# Patient Record
Sex: Male | Born: 1985
Health system: Southern US, Community
[De-identification: ages and names within clinical notes are randomized; demographics above are authoritative.]

## PROBLEM LIST (undated history)

## (undated) DIAGNOSIS — J302 Other seasonal allergic rhinitis: Secondary | ICD-10-CM

## (undated) DIAGNOSIS — Z9109 Other allergy status, other than to drugs and biological substances: Secondary | ICD-10-CM

## (undated) DIAGNOSIS — J45909 Unspecified asthma, uncomplicated: Secondary | ICD-10-CM

## (undated) HISTORY — PX: HAND SURGERY: SHX662

## (undated) HISTORY — PX: KNEE SURGERY: SHX244

---

## 2014-05-04 ENCOUNTER — Encounter: Payer: Self-pay | Admitting: *Deleted

## 2014-05-04 ENCOUNTER — Emergency Department (INDEPENDENT_AMBULATORY_CARE_PROVIDER_SITE_OTHER)
Admission: EM | Admit: 2014-05-04 | Discharge: 2014-05-04 | Disposition: A | Discharge status: Home / Self Care | Payer: 59 | Source: Home / Self Care | Attending: Emergency Medicine | Admitting: Emergency Medicine

## 2014-05-04 DIAGNOSIS — J209 Acute bronchitis, unspecified: Secondary | ICD-10-CM | POA: Diagnosis not present

## 2014-05-04 DIAGNOSIS — M545 Low back pain, unspecified: Secondary | ICD-10-CM

## 2014-05-04 HISTORY — DX: Unspecified asthma, uncomplicated: J45.909

## 2014-05-04 HISTORY — DX: Other seasonal allergic rhinitis: J30.2

## 2014-05-04 HISTORY — DX: Other allergy status, other than to drugs and biological substances: Z91.09

## 2014-05-04 MED ORDER — PREDNISONE (PAK) 10 MG PO TABS: ORAL_TABLET | ORAL | Status: DC

## 2014-05-04 MED ORDER — CYCLOBENZAPRINE HCL 10 MG PO TABS: ORAL_TABLET | ORAL | Status: DC

## 2014-05-04 MED ORDER — AZITHROMYCIN 250 MG PO TABS: ORAL_TABLET | ORAL | Status: DC

## 2014-05-04 MED ORDER — ALBUTEROL SULFATE HFA 108 (90 BASE) MCG/ACT IN AERS: 2.0000 | INHALATION_SPRAY | RESPIRATORY_TRACT | Status: DC | PRN

## 2014-05-04 NOTE — ED Notes (Signed)
[name] Tran c/o non-productive cough, congestion and runny nose x yesterday. 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

## 2014-05-04 NOTE — ED Provider Notes (Signed)
CSN: [id number]     Arrival date & time 05/04/14  [id number] History   First MD Initiated Contact with Patient 05/04/14 [phone]     Chief Complaint  Patient presents with  . Cough  . Nasal Congestion   (Consider location/radiation/quality/duration/timing/severity/associated sxs/prior Treatment) HPI URI HISTORY  [name] FearingJames is a 29 y.o. male who complains of onset of cough and cold symptoms for 3 days.  Have been using over-the-counter treatment which helps a little bit. He also has a history of seasonal environmental allergies and occasional environmentally seasonally induced asthma.  No chills/sweats +  Fever  +  Nasal congestion +  Discolored Post-nasal drainage No sinus pain/pressure No sore throat   +  Cough, productive of discolored sputum Positive wheezing Positive chest congestion No hemoptysis No shortness of breath No pleuritic pain  No itchy/red eyes No earache  No nausea No vomiting No abdominal pain No diarrhea  No skin rashes +  Fatigue No myalgias No headache  Remainder of Review of Systems negative for acute change except as noted in the HPI.  Past Medical History  Diagnosis Date  . Asthma   . Seasonal allergies   . Environmental allergies    Past Surgical History  Procedure Laterality Date  . Hand surgery    . Knee surgery     History reviewed. No pertinent family history. History  Substance Use Topics  . Smoking status: Never Smoker   . Smokeless tobacco: Current User  . Alcohol Use: No    Review of Systems  Allergies  Peanuts  Home Medications   Prior to Admission medications   Medication Sig Start Date End Date Taking? Authorizing Provider  Albuterol Sulfate (VENTOLIN HFA IN) Inhale into the lungs.   Yes Historical Provider, MD  loratadine (CLARITIN) 10 MG tablet Take 10 mg by mouth daily.   Yes Historical Provider, MD  albuterol (PROVENTIL HFA;VENTOLIN HFA) 108 (90 BASE) MCG/ACT inhaler Inhale 2 puffs into the lungs every 4 (four) hours  as needed for wheezing or shortness of breath. 05/04/14   Lajean Manesavid Massey, MD  azithromycin (ZITHROMAX Z-PAK) 250 MG tablet Take 2 tablets on day one, then 1 tablet daily on days 2 through 5 05/04/14   Lajean Manesavid Massey, MD  cyclobenzaprine (FLEXERIL) 10 MG tablet 1/2 tablet every 8 hrs as needed for muscle relaxant. May take a whole tablet at bedtime. 05/04/14   Lajean Manesavid Massey, MD  predniSONE (STERAPRED UNI-PAK) 10 MG tablet Take as directed for 6 days.--Take 6 on day 1, 5 on day 2, 4 on day 3, then 3 tablets on day 4, then 2 tablets on day 5, then 1 on day 6. 05/04/14   Lajean Manesavid Massey, MD   BP 122/80 mmHg  Pulse 68  Temp(Src) 97.9 F (36.6 C) (Oral)  Resp 14  Ht 5\' 9"  (1.753 m)  Wt 209 lb (94.802 kg)  BMI 30.85 kg/m2  SpO2 99% Physical Exam  Constitutional: He is oriented to person, place, and time. He appears well-developed and well-nourished. No distress.  HENT:  Head: Normocephalic and atraumatic.  Right Ear: Tympanic membrane normal.  Left Ear: Tympanic membrane normal.  Mouth/Throat: Oropharynx is clear and moist. No oropharyngeal exudate.  Nose with boggy terminates mild searing mucoid drainage  Eyes: Right eye exhibits no discharge. Left eye exhibits no discharge. No scleral icterus.  Neck: Neck supple.  Cardiovascular: Normal rate, regular rhythm and normal heart sounds.   Pulmonary/Chest: No respiratory distress. He has wheezes. He has rhonchi. He has no rales.  Lymphadenopathy:    He has no cervical adenopathy.  Neurological: He is alert and oriented to person, place, and time.  Skin: Skin is warm and dry.  Nursing note and vitals reviewed.   ED Course  Procedures (including critical care time) Labs Review Labs Reviewed - No data to display  Imaging Review No results found.   MDM   1. Acute bronchitis with bronchospasm   2. Bilateral low back pain without sciatica    He also has bilateral paralumbar muscle spasm without any midline spinal symptoms or any neurologic  symptoms. Treatment options discussed, as well as risks, benefits, alternatives. Patient voiced understanding and agreement with the following plans:   New Prescriptions   ALBUTEROL (PROVENTIL HFA;VENTOLIN HFA) 108 (90 BASE) MCG/ACT INHALER    Inhale 2 puffs into the lungs every 4 (four) hours as needed for wheezing or shortness of breath.   AZITHROMYCIN (ZITHROMAX Z-PAK) 250 MG TABLET    Take 2 tablets on day one, then 1 tablet daily on days 2 through 5   CYCLOBENZAPRINE (FLEXERIL) 10 MG TABLET    1/2 tablet every 8 hrs as needed for muscle relaxant. May take a whole tablet at bedtime.   PREDNISONE (STERAPRED UNI-PAK) 10 MG TABLET    Take as directed for 6 days.--Take 6 on day 1, 5 on day 2, 4 on day 3, then 3 tablets on day 4, then 2 tablets on day 5, then 1 on day 6.   Follow-up with your primary care doctor in 5-7 days if not improving, or sooner if symptoms become worse. Precautions discussed. Red flags discussed. Questions invited and answered. Patient voiced understanding and agreement.   Lajean Manes, MD 05/08/14 [phone]

## 2015-09-02 DIAGNOSIS — J4521 Mild intermittent asthma with (acute) exacerbation: Secondary | ICD-10-CM | POA: Insufficient documentation

## 2016-10-23 ENCOUNTER — Emergency Department (HOSPITAL_BASED_OUTPATIENT_CLINIC_OR_DEPARTMENT_OTHER)
Admission: EM | Admit: 2016-10-23 | Discharge: 2016-10-23 | Disposition: A | Discharge status: Home / Self Care | Payer: Worker's Compensation | Attending: Emergency Medicine | Admitting: Emergency Medicine

## 2016-10-23 ENCOUNTER — Encounter (HOSPITAL_BASED_OUTPATIENT_CLINIC_OR_DEPARTMENT_OTHER): Payer: Self-pay

## 2016-10-23 DIAGNOSIS — X58XXXA Exposure to other specified factors, initial encounter: Secondary | ICD-10-CM | POA: Diagnosis not present

## 2016-10-23 DIAGNOSIS — S61412D Laceration without foreign body of left hand, subsequent encounter: Secondary | ICD-10-CM | POA: Diagnosis present

## 2016-10-23 DIAGNOSIS — Z79899 Other long term (current) drug therapy: Secondary | ICD-10-CM | POA: Diagnosis not present

## 2016-10-23 DIAGNOSIS — Z4802 Encounter for removal of sutures: Secondary | ICD-10-CM

## 2016-10-23 DIAGNOSIS — Z91010 Allergy to peanuts: Secondary | ICD-10-CM | POA: Insufficient documentation

## 2016-10-23 MED ORDER — DOXYCYCLINE HYCLATE 100 MG PO CAPS: 100.0000 mg | ORAL_CAPSULE | Freq: Two times a day (BID) | ORAL | 0 refills | Status: DC

## 2016-10-23 MED FILL — DOXYCYCLINE HYCLATE 100 MG: 100 | 7 days supply | Qty: 14 | Fill #0

## 2016-10-23 NOTE — ED Triage Notes (Signed)
Pt states he had sutures to left hand 9/6 from [facility] injury-c/o pus to area x today-NAD-steady gait

## 2016-10-23 NOTE — ED Notes (Signed)
Pt held up in registration.

## 2016-10-23 NOTE — ED Provider Notes (Signed)
[facility] Note   CSN: [id number] Arrival date & time: 10/23/16  1116     History   Chief Complaint Chief Complaint  Patient presents with  . Hand Problem    HPI [name] Tran is a 31 y.o. male.  HPI  31 year old male presenting for evaluation of left hand injury.  Pt accidentally suffered a cut to his L hand 1 week ago.  Cut is over the 2nd knuckle.  He had it sutured at an Urgent care.  The wound has been doing fine however this morning he noticed some increased swelling and small amount of pustular discharge that he can expressed.  He is here requesting for antibiotic.  He denies increasing pain, no fever and no numbness.  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  He has been trying to keep the wound clean as best as he could.  He is UTD with immunization.  No numbness.    Past Medical History:  Diagnosis Date  . Asthma   . Environmental allergies   . Seasonal allergies     There are no active problems to display for this patient.   Past Surgical History:  Procedure Laterality Date  . HAND SURGERY    . KNEE SURGERY         Home Medications    Prior to Admission medications   Medication Sig Start Date End Date Taking? Authorizing Provider  Montelukast Sodium (SINGULAIR PO) Take by mouth.   Yes [provider]  albuterol (PROVENTIL HFA;VENTOLIN HFA) 108 (90 BASE) MCG/ACT inhaler Inhale 2 puffs into the lungs every 4 (four) hours as needed for wheezing or shortness of breath. 05/04/14   Lajean Manes, MD  Albuterol Sulfate (VENTOLIN HFA IN) Inhale into the lungs.    [provider]  loratadine (CLARITIN) 10 MG tablet Take 10 mg by mouth daily.    [provider]    Family History No family history on file.  Social History Social History  Substance Use Topics  . Smoking status: Never Smoker  . Smokeless tobacco: Never Used  . Alcohol use No     Allergies   Peanuts [peanut oil]   Review of  Systems Review of Systems  Skin: Positive for wound.  Neurological: Negative for numbness.     Physical Exam Updated Vital Signs BP (!) 140/94 (BP Location: Right Arm)   Pulse 71   Temp 98.4 F (36.9 C) (Oral)   Resp 18   Ht  (1.778 m)   Wt 85.3 kg (188 lb)   SpO2 100%   BMI 26.98 kg/m   Physical Exam  Constitutional: He appears well-developed and well-nourished. No distress.  HENT:  Head: Atraumatic.  Eyes: Conjunctivae are normal.  Neck: Neck supple.  Neurological: He is alert.  Skin: No rash noted.  Left hand: Normal appearing scar noted overlying the second metacarpal region with sutures in place, nontender to palpation, no surrounding skin erythema and no obvious pustular discharge noted.  Psychiatric: He has a normal mood and affect.  Nursing note and vitals reviewed.    ED Treatments / Results  Labs (all labs ordered are listed, but only abnormal results are displayed) Labs Reviewed - No data to display  EKG  EKG Interpretation None       Radiology No results found.  Procedures Procedures (including critical care time)  SUTURE REMOVAL Performed by: Fayrene Helper  Consent: Verbal consent obtained. Patient identity confirmed: provided demographic data Time out: Immediately prior to  procedure a "time out" was called to verify the correct patient, procedure, equipment, support staff and site/side marked as required.  Location details: L hand, 2nd MCP region dorsally  Wound Appearance: clean  Sutures/Staples Removed: sutures  Facility: sutures placed in this facility Patient tolerance: Patient tolerated the procedure well with no immediate complications.     Medications Ordered in ED Medications - No data to display   Initial Impression / Assessment and Plan / ED Course  I have reviewed the triage vital signs and the nursing notes.  Pertinent labs & imaging results that were available during my care of the patient were reviewed by me  and considered in my medical decision making (see chart for details).     BP (!) 140/94 (BP Location: Right Arm)   Pulse 71   Temp 98.4 F (36.9 C) (Oral)   Resp 18   Ht  (1.778 m)   Wt 85.3 kg (188 lb)   SpO2 100%   BMI 26.98 kg/m    Final Clinical Impressions(s) / ED Diagnoses   Final diagnoses:  Visit for suture removal    New Prescriptions New Prescriptions   DOXYCYCLINE (VIBRAMYCIN) 100 MG CAPSULE    Take 1 capsule (100 mg total) by mouth 2 (two) times daily. One po bid x 7 days   11:55 AM Pt report noticing swelling and pustular discharge from his recent lac on L hand.  I have removed the sutures.  The wound is well appearing, doubt septic joint, no obvious cellulitis or obvious abscess.  However, will prescribe doxy and recommend return precaution if worsen.    Fayrene Helper, PA-C 10/23/16 1200    Cathren Laine, MD 10/23/16 [phone]

## 2017-04-15 ENCOUNTER — Ambulatory Visit: Payer: Self-pay | Admitting: Allergy and Immunology

## 2017-05-12 ENCOUNTER — Ambulatory Visit (INDEPENDENT_AMBULATORY_CARE_PROVIDER_SITE_OTHER): Payer: BLUE CROSS/BLUE SHIELD | Admitting: Allergy and Immunology

## 2017-05-12 ENCOUNTER — Encounter: Payer: Self-pay | Admitting: Allergy and Immunology

## 2017-05-12 VITALS — BP 138/80 | HR 87 | Temp 98.6°F | Resp 16 | Ht 68.5 in | Wt 191.0 lb

## 2017-05-12 DIAGNOSIS — H1013 Acute atopic conjunctivitis, bilateral: Secondary | ICD-10-CM

## 2017-05-12 DIAGNOSIS — J4540 Moderate persistent asthma, uncomplicated: Secondary | ICD-10-CM | POA: Insufficient documentation

## 2017-05-12 DIAGNOSIS — J302 Other seasonal allergic rhinitis: Secondary | ICD-10-CM | POA: Insufficient documentation

## 2017-05-12 DIAGNOSIS — T7800XD Anaphylactic reaction due to unspecified food, subsequent encounter: Secondary | ICD-10-CM

## 2017-05-12 DIAGNOSIS — T7800XA Anaphylactic reaction due to unspecified food, initial encounter: Secondary | ICD-10-CM | POA: Insufficient documentation

## 2017-05-12 DIAGNOSIS — J3089 Other allergic rhinitis: Secondary | ICD-10-CM

## 2017-05-12 DIAGNOSIS — H1010 Acute atopic conjunctivitis, unspecified eye: Secondary | ICD-10-CM | POA: Insufficient documentation

## 2017-05-12 MED ORDER — FLUTICASONE PROPIONATE HFA 110 MCG/ACT IN AERO: 2.0000 | INHALATION_SPRAY | Freq: Two times a day (BID) | RESPIRATORY_TRACT | 5 refills | Status: DC

## 2017-05-12 MED ORDER — LEVOCETIRIZINE DIHYDROCHLORIDE 5 MG PO TABS: 5.0000 mg | ORAL_TABLET | Freq: Every evening | ORAL | 5 refills | Status: DC

## 2017-05-12 MED ORDER — EPINEPHRINE 0.3 MG/0.3ML IJ SOAJ: 0.3000 mg | Freq: Once | INTRAMUSCULAR | 1 refills | Status: AC

## 2017-05-12 MED ORDER — OLOPATADINE HCL 0.7 % OP SOLN: 1.0000 [drp] | Freq: Every day | OPHTHALMIC | 5 refills | Status: DC | PRN

## 2017-05-12 MED ORDER — FLUTICASONE PROPIONATE 93 MCG/ACT NA EXHU: 2.0000 | INHALANT_SUSPENSION | Freq: Two times a day (BID) | NASAL | 12 refills | Status: DC

## 2017-05-12 NOTE — Assessment & Plan Note (Addendum)
   Aeroallergen avoidance measures have been discussed and provided in written form.  A prescription has been provided for levocetirizine, 5 mg daily as needed.  A prescription has been provided for Xhance, 2 actuations per nostril twice a day. Proper technique has been discussed and demonstrated.  Nasal saline spray (i.e., Simply Saline) or nasal saline lavage (i.e., NeilMed) is recommended as needed and prior to medicated nasal sprays.  If allergen avoidance measures and medications fail to adequately relieve symptoms, aeroallergen immunotherapy will be considered. 

## 2017-05-12 NOTE — Assessment & Plan Note (Addendum)
Todays spirometry results, assessed while asymptomatic, suggest under-perception of bronchoconstriction.  A prescription has been provided for Flovent (fluticasone) 110 g, 2 inhalations twice a day. To maximize pulmonary deposition, a spacer has been provided along with instructions for its proper administration with an HFA inhaler.  During respiratory tract infections or asthma flares, increase Flovent 110g to 3 inhalations 3 times per day until symptoms have returned to baseline.  For now, continue montelukast 10 mg daily bedtime and albuterol HFA, 1-2 inhalations every 6 hours if needed.  Subjective and objective measures of pulmonary function will be followed and the treatment plan will be adjusted accordingly.

## 2017-05-12 NOTE — Patient Instructions (Addendum)
Persistent asthma Todays spirometry results, assessed while asymptomatic, suggest under-perception of bronchoconstriction.  A prescription has been provided for Flovent (fluticasone) 110 g, 2 inhalations twice a day. To maximize pulmonary deposition, a spacer has been provided along with instructions for its proper administration with an HFA inhaler.  During respiratory tract infections or asthma flares, increase Flovent 110g to 3 inhalations 3 times per day until symptoms have returned to baseline.  For now, continue montelukast 10 mg daily bedtime and albuterol HFA, 1-2 inhalations every 6 hours if needed.  Subjective and objective measures of pulmonary function will be followed and the treatment plan will be adjusted accordingly.  Seasonal and perennial allergic rhinitis  Aeroallergen avoidance measures have been discussed and provided in written form.  A prescription has been provided for levocetirizine, 5 mg daily as needed.  A prescription has been provided for [facility], 2 actuations per nostril twice a day. Proper technique has been discussed and demonstrated.  Nasal saline spray (i.e., Simply Saline) or nasal saline lavage (i.e., NeilMed) is recommended as needed and prior to medicated nasal sprays.  If allergen avoidance measures and medications fail to adequately relieve symptoms, aeroallergen immunotherapy will be considered.  Allergic conjunctivitis  Treatment plan as outlined above for allergic rhinitis.  A prescription has been provided for Pazeo, one drop per eye daily as needed.  I have also recommended eye lubricant drops (i.e., Natural Tears) as needed.  Food allergy The patient has a history of peanut allergy.  Food allergen skin tests today were positive to peanut and tree nuts.  Careful avoidance of peanuts and tree nuts as discussed.  A prescription has been provided for epinephrine auto-injector 2 pack along with instructions for proper administration.  A  food allergy action plan has been provided and discussed.  Medic Alert identification is recommended.   Return in about 3 months (around 08/11/2017), or if symptoms worsen or fail to improve.  Reducing Pollen Exposure  The American Academy of Allergy, Asthma and Immunology suggests the following steps to reduce your exposure to pollen during allergy seasons.    1. Do not hang sheets or clothing out to dry; pollen may collect on these items. 2. Do not mow lawns or spend time around freshly cut grass; mowing stirs up pollen. 3. Keep windows closed at night.  Keep car windows closed while driving. 4. Minimize morning activities outdoors, a time when pollen counts are usually at their highest. 5. Stay indoors as much as possible when pollen counts or humidity is high and on windy days when pollen tends to remain in the air longer. 6. Use air conditioning when possible.  Many air conditioners have filters that trap the pollen spores. 7. Use a HEPA room air filter to remove pollen form the indoor air you breathe.   Control of House Dust Mite Allergen  House dust mites play a major role in allergic asthma and rhinitis.  They occur in environments with high humidity wherever human skin, the food for dust mites is found. High levels have been detected in dust obtained from mattresses, pillows, carpets, upholstered furniture, bed covers, clothes and soft toys.  The principal allergen of the house dust mite is found in its feces.  A gram of dust may contain 1,000 mites and 250,000 fecal particles.  Mite antigen is easily measured in the air during house cleaning activities.    1. Encase mattresses, including the box spring, and pillow, in an air tight cover.  Seal the zipper end of  the encased mattresses with wide adhesive tape. 2. Wash the bedding in water of 130 degrees Farenheit weekly.  Avoid cotton comforters/quilts and flannel bedding: the most ideal bed covering is the dacron  comforter. 3. Remove all upholstered furniture from the bedroom. 4. Remove carpets, carpet padding, rugs, and non-washable window drapes from the bedroom.  Wash drapes weekly or use plastic window coverings. 5. Remove all non-washable stuffed toys from the bedroom.  Wash stuffed toys weekly. 6. Have the room cleaned frequently with a vacuum cleaner and a damp dust-mop.  The patient should not be in a room which is being cleaned and should wait 1 hour after cleaning before going into the room. 7. Close and seal all heating outlets in the bedroom.  Otherwise, the room will become filled with dust-laden air.  An electric heater can be used to heat the room. Reduce indoor humidity to less than 50%.  Do not use a humidifier.  Control of Dog or Cat Allergen  Avoidance is the best way to manage a dog or cat allergy. If you have a dog or cat and are allergic to dog or cats, consider removing the dog or cat from the home. If you have a dog or cat but don't want to find it a new home, or if your family wants a pet even though someone in the household is allergic, here are some strategies that may help keep symptoms at bay:  1. Keep the pet out of your bedroom and restrict it to only a few rooms. Be advised that keeping the dog or cat in only one room will not limit the allergens to that room. 2. Don't pet, hug or kiss the dog or cat; if you do, wash your hands with soap and water. 3. High-efficiency particulate air (HEPA) cleaners run continuously in a bedroom or living room can reduce allergen levels over time. 4. Place electrostatic material sheet in the air inlet vent in the bedroom. 5. Regular use of a high-efficiency vacuum cleaner or a central vacuum can reduce allergen levels. 6. Giving your dog or cat a bath at least once a week can reduce airborne allergen.  Control of Mold Allergen  Mold and fungi can grow on a variety of surfaces provided certain temperature and moisture conditions exist.   Outdoor molds grow on plants, decaying vegetation and soil.  The major outdoor mold, Alternaria and Cladosporium, are found in very high numbers during hot and dry conditions.  Generally, a late Summer - Fall peak is seen for common outdoor fungal spores.  Rain will temporarily lower outdoor mold spore count, but counts rise rapidly when the rainy period ends.  The most important indoor molds are Aspergillus and Penicillium.  Dark, humid and poorly ventilated basements are ideal sites for mold growth.  The next most common sites of mold growth are the bathroom and the kitchen.  Outdoor MicrosoftMold Control 1. Use air conditioning and keep windows closed 2. Avoid exposure to decaying vegetation. 3. Avoid leaf raking. 4. Avoid grain handling. 5. Consider wearing a face mask if working in moldy areas.  Indoor Mold Control 1. Maintain humidity below 50%. 2. Clean washable surfaces with 5% bleach solution. 3. Remove sources e.g. Contaminated carpets.  Control of Cockroach Allergen  Cockroach allergen has been identified as an important cause of acute attacks of asthma, especially in urban settings.  There are fifty-five species of cockroach that exist in the [place], however only three, the [place], [place] and [place] species  produce allergen that can affect patients with Asthma.  Allergens can be obtained from fecal particles, egg casings and secretions from cockroaches.    1. Remove food sources. 2. Reduce access to water. 3. Seal access and entry points. 4. Spray runways with 0.5-1% Diazinon or Chlorpyrifos 5. Blow boric acid power under stoves and refrigerator. 6. Place bait stations (hydramethylnon) at feeding sites.

## 2017-05-12 NOTE — Progress Notes (Signed)
New Patient Note  RE: [name] Tran MRN: [id number] DOB: 12-15-1985 Date of Office Visit: 05/12/2017  Referring provider: Cheral Bay, MD Primary care provider: Cheral Bay, MD  Chief Complaint: Allergic Rhinitis  and Asthma   History of present illness: [name] Tran is a 32 y.o. male seen today in consultation requested by Derek Jack, MD.  He complains of nasal congestion, rhinorrhea, sneezing, postnasal drainage, nasal pruritus, and ocular pruritus.  These symptoms occur year around but are more frequent and severe in the spring time in the fall.  In fact, during the fall he typically develops sinusitis which leads into an asthma exacerbation requiring prednisone and antibiotics.  He currently attempts to control his nasal allergy symptoms with an over-the-counter antihistamine and fluticasone nasal spray with moderate relief.  He currently experiences asthma symptoms 2 or 3 times per week.  His asthma symptoms are typically triggered by allergen exposure and/or vigorous physical exertion.  He denies nocturnal awakenings due to lower respiratory symptoms.  He was diagnosed with a peanut allergy as a child.  He carefully avoids peanuts but does not have access to epinephrine autoinjector.  Assessment and plan: Persistent asthma Todays spirometry results, assessed while asymptomatic, suggest under-perception of bronchoconstriction.  A prescription has been provided for Flovent (fluticasone) 110 g, 2 inhalations twice a day. To maximize pulmonary deposition, a spacer has been provided along with instructions for its proper administration with an HFA inhaler.  During respiratory tract infections or asthma flares, increase Flovent 110g to 3 inhalations 3 times per day until symptoms have returned to baseline.  For now, continue montelukast 10 mg daily bedtime and albuterol HFA, 1-2 inhalations every 6 hours if needed.  Subjective and objective measures of pulmonary  function will be followed and the treatment plan will be adjusted accordingly.  Seasonal and perennial allergic rhinitis  Aeroallergen avoidance measures have been discussed and provided in written form.  A prescription has been provided for levocetirizine, 5 mg daily as needed.  A prescription has been provided for [facility], 2 actuations per nostril twice a day. Proper technique has been discussed and demonstrated.  Nasal saline spray (i.e., Simply Saline) or nasal saline lavage (i.e., NeilMed) is recommended as needed and prior to medicated nasal sprays.  If allergen avoidance measures and medications fail to adequately relieve symptoms, aeroallergen immunotherapy will be considered.  Allergic conjunctivitis  Treatment plan as outlined above for allergic rhinitis.  A prescription has been provided for Pazeo, one drop per eye daily as needed.  I have also recommended eye lubricant drops (i.e., Natural Tears) as needed.  Food allergy The patient has a history of peanut allergy.  Food allergen skin tests today were positive to peanut and tree nuts.  Careful avoidance of peanuts and tree nuts as discussed.  A prescription has been provided for epinephrine auto-injector 2 pack along with instructions for proper administration.  A food allergy action plan has been provided and discussed.  Medic Alert identification is recommended.   Meds ordered this encounter  Medications  . fluticasone (FLOVENT HFA) 110 MCG/ACT inhaler    Sig: Inhale 2 puffs into the lungs 2 (two) times daily.    Dispense:  1 Inhaler    Refill:  5  . levocetirizine (XYZAL) 5 MG tablet    Sig: Take 1 tablet (5 mg total) by mouth every evening.    Dispense:  30 tablet    Refill:  5  . Fluticasone Propionate (XHANCE) 93 MCG/ACT EXHU  Sig: Place 2 sprays into the nose 2 (two) times daily.    Dispense:  32 mL    Refill:  12  . Olopatadine HCl (PAZEO) 0.7 % SOLN    Sig: Place 1 drop into both eyes daily as  needed.    Dispense:  1 Bottle    Refill:  5  . EPINEPHrine (AUVI-Q) 0.3 mg/0.3 mL IJ SOAJ injection    Sig: Inject 0.3 mLs (0.3 mg total) into the muscle once for 1 dose.    Dispense:  2 Device    Refill:  1    Diagnostics: Spirometry: FVC was 5.75 L (109% predicted) and FEV1 was 3.29 L (77% predicted), FEV1 ratio of 70%, with significant (620 mL, 19%) postbronchodilator improvement.  This study was performed while the patient was asymptomatic.  Please see scanned spirometry results for details.   Epicutaneous testing: Positive to grass pollen, weed pollen, ragweed pollen, tree pollen, and molds. Intradermal testing: Positive to dog epithelia, cockroach antigen, and dust mite antigen. Food allergen skin testing: Positive to peanut, almond, hazelnut, and pistachio.    Physical examination: Blood pressure 138/80, pulse 87, temperature 98.6 F (37 C), temperature source Oral, resp. rate 16, height 5' 8.5" (1.74 m), weight 191 lb (86.6 kg), SpO2 97 %.  General: Alert, interactive, in no acute distress. HEENT: TMs pearly gray, turbinates edematous with clear discharge, post-pharynx erythematous. Neck: Supple without lymphadenopathy. Lungs: Mildly decreased breath sounds with expiratory wheezing bilaterally. CV: Normal S1, S2 without murmurs. Abdomen: Nondistended, nontender. Skin: Warm and dry, without lesions or rashes. Extremities:  No clubbing, cyanosis or edema. Neuro:   Grossly intact.  Review of systems:  Review of systems negative except as noted in HPI / PMHx or noted below: Review of Systems  Constitutional: Negative.   HENT: Negative.   Eyes: Negative.   Respiratory: Negative.   Cardiovascular: Negative.   Gastrointestinal: Negative.   Genitourinary: Negative.   Musculoskeletal: Negative.   Skin: Negative.   Neurological: Negative.   Endo/Heme/Allergies: Negative.   Psychiatric/Behavioral: Negative.     Past medical history:  Past Medical History:  Diagnosis  Date  . Asthma   . Environmental allergies   . Seasonal allergies     Past surgical history:  Past Surgical History:  Procedure Laterality Date  . HAND SURGERY    . KNEE SURGERY      Family history: Family History  Problem Relation Age of Onset  . Asthma Mother   . Allergic rhinitis Mother   . Allergic rhinitis Father   . Asthma Father   . Angioedema Neg Hx   . Eczema Neg Hx   . Immunodeficiency Neg Hx   . Urticaria Neg Hx     Social history: Social History   Socioeconomic History  . Marital status: Single    Spouse name: Not on file  . Number of children: Not on file  . Years of education: Not on file  . Highest education level: Not on file  Occupational History  . Not on file  Social Needs  . Financial resource strain: Not on file  . Food insecurity:    Worry: Not on file    Inability: Not on file  . Transportation needs:    Medical: Not on file    Non-medical: Not on file  Tobacco Use  . Smoking status: Never Smoker  . Smokeless tobacco: Never Used  Substance and Sexual Activity  . Alcohol use: No  . Drug use: No  . Sexual activity:  Not on file  Lifestyle  . Physical activity:    Days per week: Not on file    Minutes per session: Not on file  . Stress: Not on file  Relationships  . Social connections:    Talks on phone: Not on file    Gets together: Not on file    Attends religious service: Not on file    Active member of club or organization: Not on file    Attends meetings of clubs or organizations: Not on file    Relationship status: Not on file  . Intimate partner violence:    Fear of current or ex partner: Not on file    Emotionally abused: Not on file    Physically abused: Not on file    Forced sexual activity: Not on file  Other Topics Concern  . Not on file  Social History Narrative  . Not on file   Environmental History: The patient lives in a 33 year old house with hardwood floors throughout, gas heat, and central air.  There is  a dog in the house which has access to his bedroom.  He is a non-smoker.  There is no known mold/water damage in the home.  Allergies as of 05/12/2017      Reactions   Peanuts [peanut Oil]       Medication List        Accurate as of 05/12/17  5:22 PM. Always use your most recent med list.          albuterol 108 (90 Base) MCG/ACT inhaler Commonly known as:  PROVENTIL HFA;VENTOLIN HFA Inhale 2 puffs into the lungs every 4 (four) hours as needed for wheezing or shortness of breath.   cetirizine 10 MG tablet Commonly known as:  ZYRTEC Take 10 mg by mouth as needed for allergies.   EPINEPHrine 0.3 mg/0.3 mL Soaj injection Commonly known as:  AUVI-Q Inject 0.3 mLs (0.3 mg total) into the muscle once for 1 dose.   fexofenadine 180 MG tablet Commonly known as:  ALLEGRA Take 180 mg by mouth as needed.   fluticasone 110 MCG/ACT inhaler Commonly known as:  FLOVENT HFA Inhale 2 puffs into the lungs 2 (two) times daily.   fluticasone 50 MCG/ACT nasal spray Commonly known as:  FLONASE Place 1 spray into both nostrils as needed for allergies or rhinitis.   Fluticasone Propionate 93 MCG/ACT Exhu Commonly known as:  XHANCE Place 2 sprays into the nose 2 (two) times daily.   levocetirizine 5 MG tablet Commonly known as:  XYZAL Take 1 tablet (5 mg total) by mouth every evening.   loratadine 10 MG tablet Commonly known as:  CLARITIN Take 10 mg by mouth as needed for allergies.   Olopatadine HCl 0.7 % Soln Commonly known as:  PAZEO Place 1 drop into both eyes daily as needed.   SINGULAIR PO Take by mouth.       Known medication allergies: Allergies  Allergen Reactions  . Peanuts [Peanut Oil]     I appreciate the opportunity to take part in [name] Tran's care. Please do not hesitate to contact me with questions.  Sincerely,   R. Jorene Guestarter Kacee Koren, MD

## 2017-05-12 NOTE — Assessment & Plan Note (Signed)
   Treatment plan as outlined above for allergic rhinitis.  A prescription has been provided for Pazeo, one drop per eye daily as needed.  I have also recommended eye lubricant drops (i.e., Natural Tears) as needed. 

## 2017-05-12 NOTE — Assessment & Plan Note (Addendum)
The patient has a history of peanut allergy.  Food allergen skin tests today were positive to peanut and tree nuts.  Careful avoidance of peanuts and tree nuts as discussed.  A prescription has been provided for epinephrine auto-injector 2 pack along with instructions for proper administration.  A food allergy action plan has been provided and discussed.  Medic Alert identification is recommended.

## 2017-06-08 ENCOUNTER — Emergency Department (HOSPITAL_BASED_OUTPATIENT_CLINIC_OR_DEPARTMENT_OTHER)
Admission: EM | Admit: 2017-06-08 | Discharge: 2017-06-08 | Disposition: A | Discharge status: Home / Self Care | Payer: Worker's Compensation | Attending: Emergency Medicine | Admitting: Emergency Medicine

## 2017-06-08 ENCOUNTER — Other Ambulatory Visit: Payer: Self-pay

## 2017-06-08 ENCOUNTER — Encounter (HOSPITAL_BASED_OUTPATIENT_CLINIC_OR_DEPARTMENT_OTHER): Payer: Self-pay | Admitting: *Deleted

## 2017-06-08 DIAGNOSIS — Z91010 Allergy to peanuts: Secondary | ICD-10-CM | POA: Insufficient documentation

## 2017-06-08 DIAGNOSIS — M545 Low back pain, unspecified: Secondary | ICD-10-CM

## 2017-06-08 DIAGNOSIS — J45909 Unspecified asthma, uncomplicated: Secondary | ICD-10-CM | POA: Diagnosis not present

## 2017-06-08 DIAGNOSIS — Z79899 Other long term (current) drug therapy: Secondary | ICD-10-CM | POA: Insufficient documentation

## 2017-06-08 MED ORDER — LIDOCAINE 5 % EX PTCH: 1.0000 | MEDICATED_PATCH | CUTANEOUS | 0 refills | Status: DC

## 2017-06-08 MED ORDER — MELOXICAM 15 MG PO TABS: 15.0000 mg | ORAL_TABLET | Freq: Every day | ORAL | 0 refills | Status: DC

## 2017-06-08 MED ORDER — METHOCARBAMOL 500 MG PO TABS: 500.0000 mg | ORAL_TABLET | Freq: Every evening | ORAL | 0 refills | Status: DC | PRN

## 2017-06-08 MED FILL — METHOCARBAMOL 500 MG TABLET: 500 | 20 days supply | Qty: 20 | Fill #0

## 2017-06-08 MED FILL — MELOXICAM 15 MG TABLET: 15 | 24 days supply | Qty: 24 | Fill #0

## 2017-06-08 NOTE — ED Provider Notes (Signed)
[facility] EMERGENCY DEPARTMENT Provider Note   CSN: [id number] Arrival date & time: 06/08/17  1513     History   Chief Complaint Chief Complaint  Patient presents with  . Back Injury    HPI [name] Tran is a 32 y.o. male who presents the emergency department today for right lower back pain x1 day.  Patient states that he was at work yesterday and lifted a heavy bag of sand when he had a pulling of his right lower back.  He notes that he had a tightness across his lower back as well as stiffness that was worse with ambulation as well as movement yesterday.  He notes this is greatly improved since yesterday and is now able to ambulate and move without difficulty but is afraid it may worsen in the future so he wanted to be seen here today.  Notes he has been taking ibuprofen for his symptoms with significant relief.  Has radiation of the pain down his legs or into his abdomen. Denies history of cancer, trauma, fever, upper back pain or neck pain, numbness/tingling/weakness of the lower extremities, urinary retention, loss of bowel/bladder function, saddle anesthesia.  HPI  Past Medical History:  Diagnosis Date  . Asthma   . Environmental allergies   . Seasonal allergies     Patient Active Problem List   Diagnosis Date Noted  . Persistent asthma 05/12/2017  . Seasonal and perennial allergic rhinitis 05/12/2017  . Allergic conjunctivitis 05/12/2017  . Food allergy 05/12/2017    Past Surgical History:  Procedure Laterality Date  . HAND SURGERY    . KNEE SURGERY          Home Medications    Prior to Admission medications   Medication Sig Start Date End Date Taking? Authorizing Provider  EPINEPHrine (AUVI-Q) 0.3 mg/0.3 mL IJ SOAJ injection Inject 0.3 mg into the muscle once.   Yes [provider]  albuterol (PROVENTIL HFA;VENTOLIN HFA) 108 (90 BASE) MCG/ACT inhaler Inhale 2 puffs into the lungs every 4 (four) hours as needed for wheezing or shortness of  breath. 05/04/14   Lajean Manes, MD  cetirizine (ZYRTEC) 10 MG tablet Take 10 mg by mouth as needed for allergies.    [provider]  fexofenadine (ALLEGRA) 180 MG tablet Take 180 mg by mouth as needed.     [provider]  fluticasone (FLONASE) 50 MCG/ACT nasal spray Place 1 spray into both nostrils as needed for allergies or rhinitis.    [provider]  fluticasone (FLOVENT HFA) 110 MCG/ACT inhaler Inhale 2 puffs into the lungs 2 (two) times daily. 05/12/17   Bobbitt, Heywood Iles, MD  Fluticasone Propionate Timmothy Sours) 93 MCG/ACT EXHU Place 2 sprays into the nose 2 (two) times daily. 05/12/17   Bobbitt, Heywood Iles, MD  levocetirizine (XYZAL) 5 MG tablet Take 1 tablet (5 mg total) by mouth every evening. 05/12/17   Bobbitt, Heywood Iles, MD  loratadine (CLARITIN) 10 MG tablet Take 10 mg by mouth as needed for allergies.    [provider]  Montelukast Sodium (SINGULAIR PO) Take by mouth.    [provider]  Olopatadine HCl (PAZEO) 0.7 % SOLN Place 1 drop into both eyes daily as needed. 05/12/17   Bobbitt, Heywood Iles, MD    Family History Family History  Problem Relation Age of Onset  . Asthma Mother   . Allergic rhinitis Mother   . Allergic rhinitis Father   . Asthma Father   . Angioedema Neg Hx   .  Eczema Neg Hx   . Immunodeficiency Neg Hx   . Urticaria Neg Hx     Social History Social History   Tobacco Use  . Smoking status: Never Smoker  . Smokeless tobacco: Never Used  Substance Use Topics  . Alcohol use: No  . Drug use: No     Allergies   Peanuts [peanut oil]   Review of Systems Review of Systems  All other systems reviewed and are negative.    Physical Exam Updated Vital Signs BP (!) 167/105 (BP Location: Left Arm)   Pulse 100   Temp 98.2 F (36.8 C) (Oral)   Resp 18   Ht  (1.778 m)   Wt 84.4 kg (186 lb)   SpO2 98%   BMI 26.69 kg/m   Physical Exam  Constitutional: He appears well-developed and  well-nourished. No distress.  Non-toxic appearing  HENT:  Head: Normocephalic and atraumatic.  Right Ear: External ear normal.  Left Ear: External ear normal.  Neck: Normal range of motion. Neck supple. No spinous process tenderness present. No neck rigidity. Normal range of motion present.  Cardiovascular: Normal rate, regular rhythm, normal heart sounds and intact distal pulses.  No murmur heard. Pulses:      Radial pulses are 2+ on the right side, and 2+ on the left side.       Femoral pulses are 2+ on the right side, and 2+ on the left side.      Dorsalis pedis pulses are 2+ on the right side, and 2+ on the left side.       Posterior tibial pulses are 2+ on the right side, and 2+ on the left side.  Pulmonary/Chest: Effort normal and breath sounds normal. No respiratory distress.  Abdominal: Soft. Bowel sounds are normal. He exhibits no pulsatile midline mass. There is no tenderness. There is no rigidity, no rebound and no CVA tenderness.  Musculoskeletal:  Posterior and appearance appears normal. No evidence of obvious scoliosis or kyphosis. No obvious signs of skin changes, trauma, deformity, infection. No C, T, or L spine tenderness or step-offs to palpation. No C, T  paraspinal tenderness. Right lumbar paraspinal ttp. Lung expansion normal. Bilateral lower extremity strength 5 out of 5 including extensor hallucis longus. Patellar and Achilles deep tendon reflex 2+ and equal bilaterally. Sensation of lower extremities grossly intact. Straight leg right neg. Straight leg left neg. Gait able but patient notes painful. Lower extremity compartments soft. PT and DP 2+ b/l. Cap refill <2 seconds.   Neurological: He is alert.  No foot drop  Skin: Skin is warm, dry and intact. Capillary refill takes less than 2 seconds. No rash noted. He is not diaphoretic. No erythema.  No vesicular-like rash noted.  Nursing note and vitals reviewed.    ED Treatments / Results  Labs (all labs ordered are  listed, but only abnormal results are displayed) Labs Reviewed - No data to display  EKG None  Radiology No results found.  Procedures Procedures (including critical care time)  Medications Ordered in ED Medications - No data to display   Initial Impression / Assessment and Plan / ED Course  I have reviewed the triage vital signs and the nursing notes.  Pertinent labs & imaging results that were available during my care of the patient were reviewed by me and considered in my medical decision making (see chart for details).     Patient with back pain.  No neurological deficits and normal neuro exam.  Patient can  walk but states is painful.  No loss of bowel or bladder control.  No concern for cauda equina.  No fever, night sweats, weight loss, h/o cancer, IVDU.  RICE protocol and pain medicine indicated and discussed with patient. Will treat the patient with back exercises, activity modification, muscle relaxers, NSAIDs (no history of kidney disease or GI bleed) {patient states he has relief with ibuprofen will switch to once daily mobic for convenience.  Patient is to follow-up with PCP versus warts medicine.  Strict return precautions discussed.  Patient appears safe for discharge.  Final Clinical Impressions(s) / ED Diagnoses   Final diagnoses:  Acute right-sided low back pain without sciatica    ED Discharge Orders        Ordered    lidocaine (LIDODERM) 5 %  Every 24 hours     06/08/17 1636    meloxicam (MOBIC) 15 MG tablet  Daily     06/08/17 1636    methocarbamol (ROBAXIN) 500 MG tablet  At bedtime PRN     06/08/17 1636       Princella Pellegrini 06/08/17 1636    Alvira Monday, MD 06/10/17 1325

## 2017-06-08 NOTE — ED Triage Notes (Signed)
Pt c/o lower back pain after picking up heavy abject x 2 days ago

## 2017-06-08 NOTE — Discharge Instructions (Signed)
You were seen here today for Back Pain: Low back pain is discomfort in the lower back that may be due to injuries to muscles and ligaments around the spine. Occasionally, it may be caused by a problem to a part of the spine called a disc. Your back pain should be treated with medicines listed below as well as back exercises and this back pain should get better over the next 2 weeks. Most patients get completely well in 4 weeks. It is important to know however, if you develop severe or worsening pain, low back pain with fever, numbness, weakness or inability to walk or urinate, you should return to the ER immediately.  Please follow up with your doctor this week for a recheck if still having symptoms.  HOME INSTRUCTIONS Self - care:  The application of heat can help soothe the pain.  Maintaining your daily activities, including walking (this is encouraged), as it will help you get better faster than just staying in bed. Do not life, push, pull anything more than 10 pounds for the next week. I am attaching back exercises that you can do at home to help facilitate your recovery.   Back Exercises - I have attached a handout on back exercises that can be done at home to help facilitate your recovery.   Medications are also useful to help with pain control.   Acetaminophen.  This medication is generally safe, and found over the counter. Take as directed for your age. You should not take more than 8 of the extra strength ( ) pills a day (max dose is  total OVER one day)  Non steroidal anti inflammatory: This includes medications including Ibuprofen, naproxen and Mobic; These medications help both pain and swelling and are very useful in treating back pain.  They should be taken with food, as they can cause stomach upset, and more seriously, stomach bleeding. Do not combine the medications.   Muscle relaxants:  These medications can help with muscle tightness that is a cause of lower back pain.  Most  of these medications can cause drowsiness, and it is not safe to drive or use dangerous machinery while taking them. They are primarily helpful when taken at night before sleep.  You will need to follow up with your primary healthcare provider or the sports medicine in 1-2 weeks for reassessment and persistent symptoms.  Be aware that if you develop new symptoms, such as a fever, leg weakness, difficulty with or loss of control of your urine or bowels, abdominal pain, or more severe pain, you will need to seek medical attention and/or return to the Emergency department.  Additional Information:  Your vital signs today were: BP (!) 167/105 (BP Location: Left Arm)    Pulse 100    Temp 98.2 F (36.8 C) (Oral)    Resp 18    Ht  (1.778 m)    Wt 84.4 kg (186 lb)    SpO2 98%    BMI 26.69 kg/m  If your blood pressure (BP) was elevated above 135/85 this visit, please have this repeated by your doctor within one month. ---------------

## 2017-06-08 NOTE — ED Notes (Signed)
Pt verbalizes understanding of d/c instructions and denies any further needs at this time. 

## 2017-06-24 ENCOUNTER — Telehealth: Payer: Self-pay

## 2017-06-24 MED ORDER — AZITHROMYCIN 250 MG PO TABS: ORAL_TABLET | ORAL | 0 refills | Status: DC

## 2017-06-24 MED ORDER — PREDNISONE 10 MG PO TABS: ORAL_TABLET | ORAL | 0 refills | Status: DC

## 2017-06-24 NOTE — Telephone Encounter (Signed)
Please call in a prescription for prednisone, 40 mg x3 days, 20 mg x1 day, 10 mg x1 day, then stop. Please call in a prescription for azithromycin, 500 mg on day 1 and 250 mg on days 2 through 5. Please encourage the patient use nasal saline lavage.

## 2017-06-24 NOTE — Telephone Encounter (Signed)
Pt called in and said he had a cough, with mild headache and a temp of 124f, some body ache with lymph nodes in neck a little tender, and some blood in yellow nasal mucus. Pt has had use his albuterol. Pt states he gets like this twice a year and normally prednisone knocks it out.  pts cell [phone]

## 2017-06-24 NOTE — Telephone Encounter (Signed)
Patient called back to check on status of his call.  Dr. Nunzio Cobbs just sent response.  Gave patient all information and called in meds.  Patient will keep follow up appointment with Dr Nunzio Cobbs.

## 2017-06-24 NOTE — Telephone Encounter (Signed)
Patient called back.  He cannot get in with his primary care physician.  He would like prednisone and a Z-Pak called in. He has a low grade fever, nasal congestion, PND, yellow and bloody nasal drainage.  He states this is usually a sinus infection and will settle into his lungs if not treated.  Patient has a 67 month old baby and would like the medications called in to [facility].  He was last seen on 05/12/17. He has a scheduled follow up visit 09/01/17 with Dr. Nunzio Cobbs.  Please advise.

## 2017-06-24 NOTE — Addendum Note (Signed)
Addended byClyda Greener M on: 06/24/2017 01:59 PM   Modules accepted: Orders

## 2017-09-01 ENCOUNTER — Ambulatory Visit: Payer: BLUE CROSS/BLUE SHIELD | Admitting: Allergy and Immunology

## 2017-11-09 ENCOUNTER — Ambulatory Visit (INDEPENDENT_AMBULATORY_CARE_PROVIDER_SITE_OTHER): Payer: BLUE CROSS/BLUE SHIELD | Admitting: Pediatrics

## 2017-11-09 ENCOUNTER — Encounter: Payer: Self-pay | Admitting: Pediatrics

## 2017-11-09 VITALS — BP 124/82 | HR 64 | Temp 98.2°F | Resp 16 | Wt 179.0 lb

## 2017-11-09 DIAGNOSIS — J4541 Moderate persistent asthma with (acute) exacerbation: Secondary | ICD-10-CM | POA: Diagnosis not present

## 2017-11-09 DIAGNOSIS — T7800XD Anaphylactic reaction due to unspecified food, subsequent encounter: Secondary | ICD-10-CM | POA: Diagnosis not present

## 2017-11-09 DIAGNOSIS — J301 Allergic rhinitis due to pollen: Secondary | ICD-10-CM | POA: Diagnosis not present

## 2017-11-09 DIAGNOSIS — B9789 Other viral agents as the cause of diseases classified elsewhere: Secondary | ICD-10-CM

## 2017-11-09 DIAGNOSIS — J069 Acute upper respiratory infection, unspecified: Secondary | ICD-10-CM | POA: Diagnosis not present

## 2017-11-09 MED ORDER — ALBUTEROL SULFATE HFA 108 (90 BASE) MCG/ACT IN AERS: 2.0000 | INHALATION_SPRAY | RESPIRATORY_TRACT | 2 refills | Status: DC | PRN

## 2017-11-09 NOTE — Patient Instructions (Addendum)
Levocetirizine 5 mg-take 1 tablet once a day for runny nose or itchy eyes Xhance  2 sprays per nostril twice a day for a stuffy nose or drainage.  You may decrease to 1 spray per nostril once a day once you are doing well Montelukast 10 mg-take 1 tablet once a day to prevent coughing or wheezing Flovent 110-2 puffs twice a day to prevent coughing or wheezing Ventolin 2 puffs every 4 hours if needed for wheezing or coughing spells You should have a flu shot this fall Prednisone 20 mg twice a day for 3 days, 20 mg on the fourth day, 10 mg in the fifth day to bring your asthma under control Call us if you are not doing well on this treatment plan  Avoid peanuts and tree nuts.  If you have an allergic reaction take Benadryl 50 mg every 4 hours and if you have life-threatening symptoms inject with Auvi-Q 0.3 mg

## 2017-11-09 NOTE — Progress Notes (Signed)
[address] [place] [address] Dept: [phone]  FOLLOW UP NOTE  Patient ID: [name] Tran, male    DOB: Aug 22, 1985  Age: 32 y.o. MRN: [id number] Date of Office Visit: 11/09/2017  Assessment  Chief Complaint: Sinus Problem and Sore Throat  HPI Robey Massmann presents for evaluation of nasal congestion, cough, postnasal drainage.  His symptoms began about 3 days ago.  His mucus is clear.  He is on Flovent 110 2 puffs twice a day, montelukast 10 mg once a day and Ventolin 2 puffs every 4 hours if needed.  He is using Xhance  93 one actuation per nostril once a day, and levocetirizine 5 mg once a day.  He continues to avoid peanuts and tree nuts   Drug Allergies:  Allergies  Allergen Reactions  . Peanuts [Peanut Oil]     Physical Exam: BP 124/82   Pulse 64   Temp 98.2 F (36.8 C) (Oral)   Resp 16   Wt 179 lb (81.2 kg)   SpO2 97%   BMI 25.68 kg/m    Physical Exam  Constitutional: He is oriented to person, place, and time. He appears well-developed and well-nourished.  HENT:  Eyes normal.  Ears normal.  Nose mild swelling of nasal turbinates.  Pharynx normal.  Neck: Neck supple.  Cardiovascular:  S1-S2 normal no murmurs  Pulmonary/Chest:  Clear to percussion and auscultation  Lymphadenopathy:    He has no cervical adenopathy.  Neurological: He is alert and oriented to person, place, and time.  Psychiatric: He has a normal mood and affect. His behavior is normal.  Vitals reviewed.   Diagnostics: FVC 5.52 L FEV1 3.22 L.  Predicted FVC 5.26 L predicted FEV1 4.28 L-this shows a moderate reduction in the FEV1 percent  Assessment and Plan: 1. Moderate persistent asthma with acute exacerbation   2. Anaphylactic shock due to food, subsequent encounter   3. Seasonal allergic rhinitis due to pollen   4. Viral upper respiratory tract infection with cough     Meds ordered this encounter  Medications  . albuterol (PROVENTIL HFA;VENTOLIN HFA) 108 (90 Base) MCG/ACT  inhaler    Sig: Inhale 2 puffs into the lungs every 4 (four) hours as needed for wheezing or shortness of breath.    Dispense:  18 g    Refill:  2    Patient Instructions  Levocetirizine 5 mg-take 1 tablet once a day for runny nose or itchy eyes Xhance  2 sprays per nostril twice a day for a stuffy nose or drainage.  You may decrease to 1 spray per nostril once a day once you are doing well Montelukast 10 mg-take 1 tablet once a day to prevent coughing or wheezing Flovent 110-2 puffs twice a day to prevent coughing or wheezing Ventolin 2 puffs every 4 hours if needed for wheezing or coughing spells You should have a flu shot this fall Prednisone 20 mg twice a day for 3 days, 20 mg on the fourth day, 10 mg in the fifth day to bring your asthma under control Call us if you are not doing well on this treatment plan  Avoid peanuts and tree nuts.  If you have an allergic reaction take Benadryl 50 mg every 4 hours and if you have life-threatening symptoms inject with Auvi-Q 0.3 mg   Return in about 6 months (around 05/11/2018).    Thank you for the opportunity to care for this patient.  Please do not hesitate to contact me with questions.  J.  Charyl Bigger, M.D.  [facility] [address] [place], [place] [address] [phone]

## 2017-11-27 ENCOUNTER — Other Ambulatory Visit: Payer: Self-pay | Admitting: Allergy and Immunology

## 2017-11-27 DIAGNOSIS — J3089 Other allergic rhinitis: Secondary | ICD-10-CM

## 2017-11-27 DIAGNOSIS — H1013 Acute atopic conjunctivitis, bilateral: Secondary | ICD-10-CM

## 2018-02-04 ENCOUNTER — Other Ambulatory Visit: Payer: Self-pay

## 2018-02-04 ENCOUNTER — Ambulatory Visit: Payer: Self-pay | Admitting: Orthopedic Surgery

## 2018-02-04 MED ORDER — ALBUTEROL SULFATE HFA 108 (90 BASE) MCG/ACT IN AERS: 2.0000 | INHALATION_SPRAY | RESPIRATORY_TRACT | 2 refills | Status: DC | PRN

## 2018-02-04 MED ORDER — MONTELUKAST SODIUM 10 MG PO TABS: 10.0000 mg | ORAL_TABLET | Freq: Every day | ORAL | 5 refills | Status: DC

## 2018-02-04 NOTE — Telephone Encounter (Signed)
[facility] x [facility]

## 2018-02-08 ENCOUNTER — Ambulatory Visit: Payer: Self-pay | Admitting: Orthopedic Surgery

## 2018-02-08 NOTE — H&P (View-Only) (Signed)
[name] Tran is an 32 y.o. male.   Chief Complaint: back and leg pain, numbness HPI: Reason for Visit: (normal) review of test results (lumbar MRI results) Patient is reported his numbness has not gotten a better in fact a little bit worse. No fevers or chills or change in bowel or bladder function. He is here with his wife.  Past Medical History:  Diagnosis Date  . Asthma   . Environmental allergies   . Seasonal allergies     Past Surgical History:  Procedure Laterality Date  . HAND SURGERY    . KNEE SURGERY      Family History  Problem Relation Age of Onset  . Asthma Mother   . Allergic rhinitis Mother   . Allergic rhinitis Father   . Asthma Father   . Angioedema Neg Hx   . Eczema Neg Hx   . Immunodeficiency Neg Hx   . Urticaria Neg Hx    Social History:  reports that he has never smoked. He has never used smokeless tobacco. He reports that he does not drink alcohol or use drugs.  Allergies:  Allergies  Allergen Reactions  . Peanuts [Peanut Oil] Other (See Comments)    Tree nuts    (Not in a hospital admission)   No results found for this or any previous visit (from the past 48 hour(s)). No results found.  Review of Systems  Constitutional: Negative.   HENT: Negative.   Eyes: Negative.   Respiratory: Negative.   Cardiovascular: Negative.   Gastrointestinal: Negative.   Genitourinary: Negative.   Musculoskeletal: Positive for back pain.  Skin: Negative.   Neurological: Positive for sensory change and focal weakness.  Psychiatric/Behavioral: Negative.     There were no vitals taken for this visit. Physical Exam  Constitutional: He is oriented to person, place, and time. He appears well-developed and well-nourished. He appears distressed.  HENT:  Head: Normocephalic.  Eyes: Pupils are equal, round, and reactive to light.  Neck: Normal range of motion.  Cardiovascular: Normal rate.  Respiratory: Effort normal.  GI: Soft.  Musculoskeletal:   Comments: Patient is a 32-year-old male.  Gait and Station: Appearance: ambulating with no assistive devices and antalgic gait.  Constitutional: General Appearance: healthy-appearing and distress (mild).  Psychiatric: Mood and Affect: active and alert.  Cardiovascular System: Edema Right: none; Dorsalis and posterior tibial pulses 2+. Edema Left: none.  Abdomen: Inspection and Palpation: non-distended and no tenderness.  Skin: Inspection and palpation: no rash.  Lumbar Spine: Inspection: normal alignment. Bony Palpation of the Lumbar Spine: tender at lumbosacral junction.. Bony Palpation of the Right Hip: no tenderness of the greater trochanter and tenderness of the SI joint; Pelvis stable. Bony Palpation of the Left Hip: no tenderness of the greater trochanter and tenderness of the SI joint. Soft Tissue Palpation on the Right: No flank pain with percussion. Active Range of Motion: limited flexion and extention.  Motor Strength: L1 Motor Strength on the Right: hip flexion iliopsoas 5/5. L1 Motor Strength on the Left: hip flexion iliopsoas 5/5. L2-L4 Motor Strength on the Right: knee extension quadriceps 5/5. L2-L4 Motor Strength on the Left: knee extension quadriceps 5/5. L5 Motor Strength on the Right: ankle dorsiflexion tibialis anterior 5/5 and great toe extension extensor hallucis longus 5/5. L5 Motor Strength on the Left: ankle dorsiflexion tibialis anterior 5/5 and great toe extension extensor hallucis longus 4/5. S1 Motor Strength on the Right: plantar flexion gastrocnemius 5/5. S1 Motor Strength on the Left: plantar flexion gastrocnemius 4/5.  Neurological   System: Knee Reflex Right: normal (2). Knee Reflex Left: normal (2). Ankle Reflex Right: normal (2). Ankle Reflex Left: diminished (1). Babinski Reflex Right: plantar reflex absent. Babinski Reflex Left: plantar reflex absent. Sensation on the Right: normal distal extremities. Sensation on the Left: normal distal extremities and dereased  sensation on the sole of the foot and the posterior leg (S1). Special Tests on the Right: no clonus of the ankle/knee and seated straight leg raising test positive. Special Tests on the Left: no clonus of the ankle/knee and seated straight leg raising test positive.  On the left for buttock pain calf pain. On the right for left buttock pain.  Neurological: He is alert and oriented to person, place, and time.    MRI of the lumbar spine demonstrates multilevel degenerative changes most prominent at L4-5.  At L4-5 there is moderate stenosis secondary to a large disc herniation central into the left with extrusion.  Paraspinous muscle spasm is noted.  Multilevel Schmorl's node formation is noted. Slight retrolisthesis of L4 and L5.  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 He has significant EHL and dorsiflexion weakness as well as altered sensation L5 dermatome.  This is not improved but has persisted and in fact perhaps some increase in numbness. No significant motor change in the past 48 hours.  We discussed his pathology relevant anatomy and treatment options.  I had an extensive discussion with the patient concerning their pathology relevant anatomy and treatment options. I discussed the concepts of disc pressure management as well as core motion and postural modifications. This would include for example to avoid unsupported bending. Utilization of appropriate bracing techniques. Avoiding twisting to the spine utilizing stepping and core motion. Also avoiding prolonged standing or sitting to augment nutrition to the disc which occurs by motion. Avoid sitting in soft sofas. I provided the patient with an illustrated handout describing ADL modification. In addition, we discussed core strengthening exercises and strategies to avoid reinjury.  We discussed options at this point in time including  continued conservative treatment. His wife is a excellent physical therapist. She has been advising him on activity modification home exercises to no avail.  We discussed of place him on a second steroid dose pack 5 mg 6 day.  He would like to proceed with surgical intervention after discussing all the options including continued observation. Given the presence of his numbness and significant weakness and the large disc herniation and a neural compressive lesion I feel that is reasonable.  We will proceed with the scheduling. And there is any changes in the meantime he is to call. We discussed in the meantime rest avoiding compression etc.  I had an extensive discussion with the patient concerning the pathology relevant anatomy and treatment options. At this point exhausting conservative treatment and in the presence of a neurologic deficit we discussed microlumbar decompression. I discussed the risks and benefits including bleeding, infection, DVT, PE, anesthetic complications, worsening in their symptoms, improvement in their symptoms, C SF leakage, epidural fibrosis, need for future surgeries such as revision discectomy and lumbar fusion. I also indicated that this is an operation to basically decompress the nerve root to allow recovery as opposed to fixing a herniated disc and that the incidence of recurrent chest disc herniation can approach 15%. Also that nerve root recovery is variable and may not recover completely.  I discussed the operative course including overnight in the hospital. Immediate ambulation. Follow-up   in 2 weeks for suture removal. 6 weeks until healing of the herniation followed by 6 weeks of reconditioning and strengthening of the core musculature. Also discussed the need to employ the concepts of disc pressure management and core motion following the surgery to minimize the risk of recurrent disc herniation. We will obtain preoperative clearance i if necessary and proceed  accordingly.  Plan microlumbar decompression L4-5  Nikolas Casher M., PA-C for Dr. Beane 02/08/2018, 2:24 PM   

## 2018-02-08 NOTE — H&P (Signed)
[name] Tran is an 32 y.o. male.   Chief Complaint: back and leg pain, numbness HPI: Reason for Visit: (normal) review of test results (lumbar MRI results) Patient is reported his numbness has not gotten a better in fact a little bit worse. No fevers or chills or change in bowel or bladder function. He is here with his wife.  Past Medical History:  Diagnosis Date  . Asthma   . Environmental allergies   . Seasonal allergies     Past Surgical History:  Procedure Laterality Date  . HAND SURGERY    . KNEE SURGERY      Family History  Problem Relation Age of Onset  . Asthma Mother   . Allergic rhinitis Mother   . Allergic rhinitis Father   . Asthma Father   . Angioedema Neg Hx   . Eczema Neg Hx   . Immunodeficiency Neg Hx   . Urticaria Neg Hx    Social History:  reports that he has never smoked. He has never used smokeless tobacco. He reports that he does not drink alcohol or use drugs.  Allergies:  Allergies  Allergen Reactions  . Peanuts [Peanut Oil] Other (See Comments)    Tree nuts    (Not in a hospital admission)   No results found for this or any previous visit (from the past 48 hour(s)). No results found.  Review of Systems  Constitutional: Negative.   HENT: Negative.   Eyes: Negative.   Respiratory: Negative.   Cardiovascular: Negative.   Gastrointestinal: Negative.   Genitourinary: Negative.   Musculoskeletal: Positive for back pain.  Skin: Negative.   Neurological: Positive for sensory change and focal weakness.  Psychiatric/Behavioral: Negative.     There were no vitals taken for this visit. Physical Exam  Constitutional: He is oriented to person, place, and time. He appears well-developed and well-nourished. He appears distressed.  HENT:  Head: Normocephalic.  Eyes: Pupils are equal, round, and reactive to light.  Neck: Normal range of motion.  Cardiovascular: Normal rate.  Respiratory: Effort normal.  GI: Soft.  Musculoskeletal:   Comments: Patient is a 32 year old male.  Gait and Station: Appearance: ambulating with no assistive devices and antalgic gait.  Constitutional: General Appearance: healthy-appearing and distress (mild).  Psychiatric: Mood and Affect: active and alert.  Cardiovascular System: Edema Right: none; Dorsalis and posterior tibial pulses 2+. Edema Left: none.  Abdomen: Inspection and Palpation: non-distended and no tenderness.  Skin: Inspection and palpation: no rash.  Lumbar Spine: Inspection: normal alignment. Bony Palpation of the Lumbar Spine: tender at lumbosacral junction.. Bony Palpation of the Right Hip: no tenderness of the greater trochanter and tenderness of the SI joint; Pelvis stable. Bony Palpation of the Left Hip: no tenderness of the greater trochanter and tenderness of the SI joint. Soft Tissue Palpation on the Right: No flank pain with percussion. Active Range of Motion: limited flexion and extention.  Motor Strength: L1 Motor Strength on the Right: hip flexion iliopsoas 5/5. L1 Motor Strength on the Left: hip flexion iliopsoas 5/5. L2-L4 Motor Strength on the Right: knee extension quadriceps 5/5. L2-L4 Motor Strength on the Left: knee extension quadriceps 5/5. L5 Motor Strength on the Right: ankle dorsiflexion tibialis anterior 5/5 and great toe extension extensor hallucis longus 5/5. L5 Motor Strength on the Left: ankle dorsiflexion tibialis anterior 5/5 and great toe extension extensor hallucis longus 4/5. S1 Motor Strength on the Right: plantar flexion gastrocnemius 5/5. S1 Motor Strength on the Left: plantar flexion gastrocnemius 4/5.  Neurological  System: Knee Reflex Right: normal (2). Knee Reflex Left: normal (2). Ankle Reflex Right: normal (2). Ankle Reflex Left: diminished (1). Babinski Reflex Right: plantar reflex absent. Babinski Reflex Left: plantar reflex absent. Sensation on the Right: normal distal extremities. Sensation on the Left: normal distal extremities and dereased  sensation on the sole of the foot and the posterior leg (S1). Special Tests on the Right: no clonus of the ankle/knee and seated straight leg raising test positive. Special Tests on the Left: no clonus of the ankle/knee and seated straight leg raising test positive.  On the left for buttock pain calf pain. On the right for left buttock pain.  Neurological: He is alert and oriented to person, place, and time.    MRI of the lumbar spine demonstrates multilevel degenerative changes most prominent at L4-5.  At L4-5 there is moderate stenosis secondary to a large disc herniation central into the left with extrusion.  Paraspinous muscle spasm is noted.  Multilevel Schmorl's node formation is noted. Slight retrolisthesis of L4 and L5.  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 He has significant EHL and dorsiflexion weakness as well as altered sensation L5 dermatome.  This is not improved but has persisted and in fact perhaps some increase in numbness. No significant motor change in the past 48 hours.  We discussed his pathology relevant anatomy and treatment options.  I had an extensive discussion with the patient concerning their pathology relevant anatomy and treatment options. I discussed the concepts of disc pressure management as well as core motion and postural modifications. This would include for example to avoid unsupported bending. Utilization of appropriate bracing techniques. Avoiding twisting to the spine utilizing stepping and core motion. Also avoiding prolonged standing or sitting to augment nutrition to the disc which occurs by motion. Avoid sitting in soft sofas. I provided the patient with an illustrated handout describing ADL modification. In addition, we discussed core strengthening exercises and strategies to avoid reinjury.  We discussed options at this point in time including  continued conservative treatment. His wife is a excellent physical therapist. She has been advising him on activity modification home exercises to no avail.  We discussed of place him on a second steroid dose pack 5 mg 6 day.  He would like to proceed with surgical intervention after discussing all the options including continued observation. Given the presence of his numbness and significant weakness and the large disc herniation and a neural compressive lesion I feel that is reasonable.  We will proceed with the scheduling. And there is any changes in the meantime he is to call. We discussed in the meantime rest avoiding compression etc.  I had an extensive discussion with the patient concerning the pathology relevant anatomy and treatment options. At this point exhausting conservative treatment and in the presence of a neurologic deficit we discussed microlumbar decompression. I discussed the risks and benefits including bleeding, infection, DVT, PE, anesthetic complications, worsening in their symptoms, improvement in their symptoms, C SF leakage, epidural fibrosis, need for future surgeries such as revision discectomy and lumbar fusion. I also indicated that this is an operation to basically decompress the nerve root to allow recovery as opposed to fixing a herniated disc and that the incidence of recurrent chest disc herniation can approach 15%. Also that nerve root recovery is variable and may not recover completely.  I discussed the operative course including overnight in the hospital. Immediate ambulation. Follow-up  in 2 weeks for suture removal. 6 weeks until healing of the herniation followed by 6 weeks of reconditioning and strengthening of the core musculature. Also discussed the need to employ the concepts of disc pressure management and core motion following the surgery to minimize the risk of recurrent disc herniation. We will obtain preoperative clearance i if necessary and proceed  accordingly.  Plan microlumbar decompression L4-5  Dorothy SparkBISSELL, Adaliah Hiegel M., PA-C for Dr. Shelle IronBeane 02/08/2018, 2:24 PM

## 2018-02-13 ENCOUNTER — Other Ambulatory Visit: Payer: Self-pay

## 2018-02-13 ENCOUNTER — Encounter (HOSPITAL_BASED_OUTPATIENT_CLINIC_OR_DEPARTMENT_OTHER): Payer: Self-pay | Admitting: Emergency Medicine

## 2018-02-13 ENCOUNTER — Emergency Department (HOSPITAL_BASED_OUTPATIENT_CLINIC_OR_DEPARTMENT_OTHER)
Admission: EM | Admit: 2018-02-13 | Discharge: 2018-02-13 | Disposition: A | Discharge status: Home / Self Care | Payer: BLUE CROSS/BLUE SHIELD | Attending: Emergency Medicine | Admitting: Emergency Medicine

## 2018-02-13 DIAGNOSIS — R197 Diarrhea, unspecified: Secondary | ICD-10-CM | POA: Diagnosis present

## 2018-02-13 DIAGNOSIS — Z79899 Other long term (current) drug therapy: Secondary | ICD-10-CM | POA: Insufficient documentation

## 2018-02-13 DIAGNOSIS — K529 Noninfective gastroenteritis and colitis, unspecified: Secondary | ICD-10-CM | POA: Diagnosis not present

## 2018-02-13 DIAGNOSIS — Z91010 Allergy to peanuts: Secondary | ICD-10-CM | POA: Insufficient documentation

## 2018-02-13 DIAGNOSIS — J45909 Unspecified asthma, uncomplicated: Secondary | ICD-10-CM | POA: Insufficient documentation

## 2018-02-13 LAB — COMPREHENSIVE METABOLIC PANEL
ALK PHOS: 91 U/L (ref 38–126)
ALT: 34 U/L (ref 0–44)
AST: 27 U/L (ref 15–41)
Albumin: 4.8 g/dL (ref 3.5–5.0)
Anion gap: 10 (ref 5–15)
BUN: 19 mg/dL (ref 6–20)
CO2: 26 mmol/L (ref 22–32)
Calcium: 9.1 mg/dL (ref 8.9–10.3)
Chloride: 101 mmol/L (ref 98–111)
Creatinine, Ser: 1.1 mg/dL (ref 0.61–1.24)
GFR calc non Af Amer: >60 mL/min (ref >60)
Glucose, Bld: 193 mg/dL — ABNORMAL HIGH (ref 70–99)
Potassium: 4.3 mmol/L (ref 3.5–5.1)
Sodium: 137 mmol/L (ref 135–145)
Total Bilirubin: 1.8 mg/dL — ABNORMAL HIGH (ref 0.3–1.2)
Total Protein: 7.8 g/dL (ref 6.5–8.1)

## 2018-02-13 LAB — CBC
HCT: 53.8 % — ABNORMAL HIGH (ref 39.0–52.0)
Hemoglobin: 17.5 g/dL — ABNORMAL HIGH (ref 13.0–17.0)
MCH: 30.1 pg (ref 26.0–34.0)
MCHC: 32.5 g/dL (ref 30.0–36.0)
MCV: 92.4 fL (ref 80.0–100.0)
NRBC: 0 % (ref 0.0–0.2)
PLATELETS: 311 10*3/uL (ref 150–400)
RBC: 5.82 MIL/uL — ABNORMAL HIGH (ref 4.22–5.81)
RDW: 12.4 % (ref 11.5–15.5)
WBC: 17.5 10*3/uL — ABNORMAL HIGH (ref 4.0–10.5)

## 2018-02-13 LAB — LIPASE, BLOOD: Lipase: 26 U/L (ref 11–51)

## 2018-02-13 LAB — CBG MONITORING, ED: GLUCOSE-CAPILLARY: 116 mg/dL — AB (ref 70–99)

## 2018-02-13 MED ORDER — SODIUM CHLORIDE 0.9 % IV BOLUS
1000.0000 mL | Freq: Once | INTRAVENOUS | Status: AC
  Administered 2018-02-13: 1000 mL via INTRAVENOUS

## 2018-02-13 MED ORDER — PROMETHAZINE HCL 25 MG PO TABS: 25.0000 mg | ORAL_TABLET | Freq: Three times a day (TID) | ORAL | 0 refills | Status: DC | PRN

## 2018-02-13 MED ORDER — ONDANSETRON HCL 4 MG/2ML IJ SOLN
4.0000 mg | Freq: Once | INTRAMUSCULAR | Status: AC | PRN
  Administered 2018-02-13: 4 mg via INTRAVENOUS
  Filled 2018-02-13: qty 2

## 2018-02-13 NOTE — ED Provider Notes (Signed)
[facility] EMERGENCY DEPARTMENT Provider Note   CSN: [id number] Arrival date & time: 02/13/18  0548     History   Chief Complaint Chief Complaint  Patient presents with  . Emesis  . Diarrhea    HPI [name] Tran is a 33 y.o. male.  Pt presents with n/v/d that started last night at 9pm.  He has been vomiting all night.  No associated abdominal pain.  His wife had similar symptoms last week.  No urinary symptoms.    No known fevers.  He is feeling a little better after receiving zofran in the ED this am.     Past Medical History:  Diagnosis Date  . Asthma   . Environmental allergies   . Seasonal allergies     Patient Active Problem List   Diagnosis Date Noted  . Viral upper respiratory tract infection with cough 11/09/2017  . Seasonal allergic rhinitis due to pollen 11/09/2017  . Persistent asthma 05/12/2017  . Seasonal and perennial allergic rhinitis 05/12/2017  . Allergic conjunctivitis 05/12/2017  . Food allergy 05/12/2017    Past Surgical History:  Procedure Laterality Date  . HAND SURGERY    . KNEE SURGERY          Home Medications    Prior to Admission medications   Medication Sig Start Date End Date Taking? Authorizing Provider  albuterol (PROVENTIL HFA;VENTOLIN HFA) 108 (90 Base) MCG/ACT inhaler Inhale 2 puffs into the lungs every 4 (four) hours as needed for wheezing or shortness of breath. 02/04/18   Fletcher Anon, MD  cetirizine (ZYRTEC) 10 MG tablet Take 10 mg by mouth daily as needed for allergies.     [provider]  EPINEPHrine (AUVI-Q) 0.3 mg/0.3 mL IJ SOAJ injection Inject 0.3 mg into the muscle once.    [provider]  fexofenadine (ALLEGRA) 180 MG tablet Take 180 mg by mouth daily as needed for allergies.     [provider]  fluticasone (FLOVENT HFA) 110 MCG/ACT inhaler Inhale 2 puffs into the lungs 2 (two) times daily. 05/12/17   Bobbitt, Heywood Iles, MD  Fluticasone Propionate Timmothy Sours) 93 MCG/ACT  EXHU Place 2 sprays into the nose 2 (two) times daily. 05/12/17   Bobbitt, Heywood Iles, MD  gabapentin (NEURONTIN) 300 MG capsule Take 300 mg by mouth 2 (two) times daily.    [provider]  levocetirizine (XYZAL) 5 MG tablet TAKE ONE TABLET BY MOUTH EVERY EVENING Patient taking differently: Take 5 mg by mouth every morning.  11/29/17   Fletcher Anon, MD  montelukast (SINGULAIR) 10 MG tablet Take 1 tablet (10 mg total) by mouth at bedtime. 02/04/18   Fletcher Anon, MD  promethazine (PHENERGAN) 25 MG tablet Take 1 tablet (25 mg total) by mouth every 8 (eight) hours as needed for nausea or vomiting. 02/13/18   Rolan Bucco, MD    Family History Family History  Problem Relation Age of Onset  . Asthma Mother   . Allergic rhinitis Mother   . Allergic rhinitis Father   . Asthma Father   . Angioedema Neg Hx   . Eczema Neg Hx   . Immunodeficiency Neg Hx   . Urticaria Neg Hx     Social History Social History   Tobacco Use  . Smoking status: Never Smoker  . Smokeless tobacco: Never Used  Substance Use Topics  . Alcohol use: No  . Drug use: No     Allergies   Peanuts [peanut oil]   Review of  Systems Review of Systems  Constitutional: Positive for fatigue. Negative for chills, diaphoresis and fever.  HENT: Negative for congestion, rhinorrhea and sneezing.   Eyes: Negative.   Respiratory: Negative for cough, chest tightness and shortness of breath.   Cardiovascular: Negative for chest pain and leg swelling.  Gastrointestinal: Positive for diarrhea, nausea and vomiting. Negative for abdominal pain and blood in stool.  Genitourinary: Negative for difficulty urinating, flank pain, frequency and hematuria.  Musculoskeletal: Negative for arthralgias and back pain.  Skin: Negative for rash.  Neurological: Negative for dizziness, speech difficulty, weakness, numbness and headaches.     Physical Exam Updated Vital Signs BP 121/74   Pulse (!) 101   Temp 98 F (36.7  C) (Oral)   Resp (!) 22   Ht 5\' 8"  (1.727 m)   Wt 81.6 kg   SpO2 97%   BMI 27.37 kg/m   Physical Exam Constitutional:      Appearance: He is well-developed.  HENT:     Head: Normocephalic and atraumatic.     Mouth/Throat:     Mouth: Mucous membranes are dry.  Eyes:     Pupils: Pupils are equal, round, and reactive to light.  Neck:     Musculoskeletal: Normal range of motion and neck supple.  Cardiovascular:     Rate and Rhythm: Normal rate and regular rhythm.     Heart sounds: Normal heart sounds.  Pulmonary:     Effort: Pulmonary effort is normal. No respiratory distress.     Breath sounds: Normal breath sounds. No wheezing or rales.  Chest:     Chest wall: No tenderness.  Abdominal:     General: Bowel sounds are normal.     Palpations: Abdomen is soft.     Tenderness: There is no abdominal tenderness. There is no guarding or rebound.  Musculoskeletal: Normal range of motion.  Lymphadenopathy:     Cervical: No cervical adenopathy.  Skin:    General: Skin is warm and dry.     Findings: No rash.  Neurological:     Mental Status: He is alert and oriented to person, place, and time.      ED Treatments / Results  Labs (all labs ordered are listed, but only abnormal results are displayed) Labs Reviewed  COMPREHENSIVE METABOLIC PANEL - Abnormal; Notable for the following components:      Result Value   Glucose, Bld 193 (*)    Total Bilirubin 1.8 (*)    All other components within normal limits  CBC - Abnormal; Notable for the following components:   WBC 17.5 (*)    RBC 5.82 (*)    Hemoglobin 17.5 (*)    HCT 53.8 (*)    All other components within normal limits  CBG MONITORING, ED - Abnormal; Notable for the following components:   Glucose-Capillary 116 (*)    All other components within normal limits  LIPASE, BLOOD    EKG None  Radiology No results found.  Procedures Procedures (including critical care time)  Medications Ordered in ED Medications    ondansetron (ZOFRAN) injection 4 mg (4 mg Intravenous Given 02/13/18 0606)  sodium chloride 0.9 % bolus 1,000 mL (0 mLs Intravenous Stopped 02/13/18 0729)  sodium chloride 0.9 % bolus 1,000 mL (1,000 mLs Intravenous New Bag/Given 02/13/18 0746)     Initial Impression / Assessment and Plan / ED Course  I have reviewed the triage vital signs and the nursing notes.  Pertinent labs & imaging results that were available during my  care of the patient were reviewed by me and considered in my medical decision making (see chart for details).     Patient is a 33 year old male who presents with vomiting and diarrhea.  He is feeling much better after IV fluids and antiemetics.  He is able to tolerate oral fluids without difficulty.  He has no associated abdominal pain.  His white count is elevated but he has no pain which would be more concerning for appendicitis or other intra-abdominal pathology.  His labs show an elevated glucose at 193 but otherwise unremarkable.  He has no known history of diabetes.  On recheck after fluids it was 116.  He was encouraged to have this followed up by his primary care physician.  Return precautions were given.  Final Clinical Impressions(s) / ED Diagnoses   Final diagnoses:  Gastroenteritis    ED Discharge Orders         Ordered    promethazine (PHENERGAN) 25 MG tablet  Every 8 hours PRN     02/13/18 0906           Rolan BuccoBelfi, Bomani Oommen, MD 02/13/18 [phone]

## 2018-02-13 NOTE — Discharge Instructions (Signed)
Your blood sugar was elevated at 193.  This needs to be rechecked by your primary care physician.  Stay on clear liquids for the next 24 hours.  Return here as needed for any worsening symptoms including worsening abdominal pain, vomiting or other worsening symptoms.

## 2018-02-13 NOTE — ED Triage Notes (Signed)
Pt states that since yesterday at 2100 he has had diarrhea and vomiting at least once a hour for each. Denies fever. States that his wife was seen here for same thing.

## 2018-02-14 NOTE — Pre-Procedure Instructions (Signed)
[name] Tran  02/14/2018      [facility] - [place], [place] - [address] [place] [address] Phone: [phone] Fax: [phone]  [facility] - [place], [place] - [address] [place] [address] Phone: [phone] Fax: [phone]  [facility] - [place], [place] - [address] [place] [address] Phone: [phone] Fax: [phone]    Your procedure is scheduled on Friday, January 9th.  Report to [facility] Admitting at 8:00 A.M.  Call this number if you have problems the morning of surgery:  [phone]   Remember:  Do not eat or drink after midnight.    Take these medicines the morning of surgery with A SIP OF WATER  cetirizine (ZYRTEC)  fexofenadine (ALLEGRA) gabapentin (NEURONTIN) levocetirizine (XYZAL)  promethazine (PHENERGAN)-if needed.  fluticasone (FLOVENT HFA) Inhaler-if needed; please bring with you to the hospital.   As of today, STOP taking any Aspirin (unless otherwise instructed by your surgeon), Aleve, Naproxen, Ibuprofen, Motrin, Advil, Goody's, BC's, all herbal medications, fish oil, and all vitamins.    Do not wear jewelry.  Do not wear lotions, powders, or colognes, or deodorant.  Men may shave face and neck.  Do not bring valuables to the hospital.  [facility] is not responsible for any belongings or valuables.  Contacts, dentures or bridgework may not be worn into surgery.  Leave your suitcase in the car.  After surgery it may be brought to your room.  For patients admitted to the hospital, discharge time will be determined by your treatment team.  Patients discharged the day of surgery will not be allowed to drive home.   Special instructions:   [facility]- Preparing For Surgery  Before surgery, you can play an important role. Because skin is not sterile, your skin needs to be as free of germs  as possible. You can reduce the number of germs on your skin by washing with CHG (chlorahexidine gluconate) Soap before surgery.  CHG is an antiseptic cleaner which kills germs and bonds with the skin to continue killing germs even after washing.    Oral Hygiene is also important to reduce your risk of infection.  Remember - BRUSH YOUR TEETH THE MORNING OF SURGERY WITH YOUR REGULAR TOOTHPASTE  Please do not use if you have an allergy to CHG or antibacterial soaps. If your skin becomes reddened/irritated stop using the CHG.  Do not shave (including legs and underarms) for at least 48 hours prior to first CHG shower. It is OK to shave your face.  Please follow these instructions carefully.   1. Shower the NIGHT BEFORE SURGERY and the MORNING OF SURGERY with CHG.   2. If you chose to wash your hair, wash your hair first as usual with your normal shampoo.  3. After you shampoo, rinse your hair and body thoroughly to remove the shampoo.  4. Use CHG as you would any other liquid soap. You can apply CHG directly to the skin and wash gently with a scrungie or a clean washcloth.   5. Apply the CHG Soap to your body ONLY FROM THE NECK DOWN.  Do not use on open wounds or open sores. Avoid contact with your eyes, ears, mouth and genitals (private parts). Wash Face and genitals (private parts)  with your normal soap.  6. Wash thoroughly, paying special attention to the  area where your surgery will be performed.  7. Thoroughly rinse your body with warm water from the neck down.  8. DO NOT shower/wash with your normal soap after using and rinsing off the CHG Soap.  9. Pat yourself dry with a CLEAN TOWEL.  10. Wear CLEAN PAJAMAS to bed the night before surgery, wear comfortable clothes the morning of surgery  11. Place CLEAN SHEETS on your bed the night of your first shower and DO NOT SLEEP WITH PETS.    Day of Surgery:  Do not apply any deodorants/lotions.  Please wear clean clothes to the  hospital/surgery center.   Remember to brush your teeth WITH YOUR REGULAR TOOTHPASTE.    Please read over the following fact sheets that you were given.

## 2018-02-15 ENCOUNTER — Encounter (HOSPITAL_COMMUNITY): Payer: Self-pay

## 2018-02-15 ENCOUNTER — Encounter (HOSPITAL_COMMUNITY)
Admission: RE | Admit: 2018-02-15 | Discharge: 2018-02-15 | Disposition: A | Discharge status: Home / Self Care | Payer: BLUE CROSS/BLUE SHIELD | Source: Ambulatory Visit | Attending: Specialist | Admitting: Specialist

## 2018-02-15 ENCOUNTER — Encounter (HOSPITAL_COMMUNITY)
Admission: RE | Admit: 2018-02-15 | Discharge: 2018-02-15 | Disposition: A | Discharge status: Home / Self Care | Payer: BLUE CROSS/BLUE SHIELD | Source: Ambulatory Visit | Attending: Orthopedic Surgery | Admitting: Orthopedic Surgery

## 2018-02-15 ENCOUNTER — Other Ambulatory Visit: Payer: Self-pay

## 2018-02-15 ENCOUNTER — Ambulatory Visit: Payer: Self-pay | Admitting: Orthopedic Surgery

## 2018-02-15 DIAGNOSIS — Z01818 Encounter for other preprocedural examination: Secondary | ICD-10-CM | POA: Insufficient documentation

## 2018-02-15 DIAGNOSIS — M419 Scoliosis, unspecified: Secondary | ICD-10-CM | POA: Insufficient documentation

## 2018-02-15 DIAGNOSIS — M5126 Other intervertebral disc displacement, lumbar region: Secondary | ICD-10-CM

## 2018-02-15 DIAGNOSIS — M47896 Other spondylosis, lumbar region: Secondary | ICD-10-CM | POA: Insufficient documentation

## 2018-02-15 LAB — BASIC METABOLIC PANEL
Anion gap: 12 (ref 5–15)
BUN: 9 mg/dL (ref 6–20)
CO2: 24 mmol/L (ref 22–32)
Calcium: 9 mg/dL (ref 8.9–10.3)
Chloride: 103 mmol/L (ref 98–111)
Creatinine, Ser: 1 mg/dL (ref 0.61–1.24)
GFR calc non Af Amer: >60 mL/min (ref >60)
Glucose, Bld: 111 mg/dL — ABNORMAL HIGH (ref 70–99)
Potassium: 4 mmol/L (ref 3.5–5.1)
Sodium: 139 mmol/L (ref 135–145)

## 2018-02-15 LAB — CBC
HCT: 47.8 % (ref 39.0–52.0)
Hemoglobin: 15.8 g/dL (ref 13.0–17.0)
MCH: 30 pg (ref 26.0–34.0)
MCHC: 33.1 g/dL (ref 30.0–36.0)
MCV: 90.7 fL (ref 80.0–100.0)
NRBC: 0 % (ref 0.0–0.2)
Platelets: 214 10*3/uL (ref 150–400)
RBC: 5.27 MIL/uL (ref 4.22–5.81)
RDW: 12.1 % (ref 11.5–15.5)
WBC: 7.1 10*3/uL (ref 4.0–10.5)

## 2018-02-15 LAB — SURGICAL PCR SCREEN
MRSA, PCR: POSITIVE — AB
Staphylococcus aureus: POSITIVE — AB

## 2018-02-15 NOTE — Progress Notes (Signed)
PCP - Dr. Derek Jack  Cardiologist - denies  Chest x-ray - N/A Lumbar Spine 2-3 view-02/15/18 EKG - N/A Stress Test -denies  ECHO - denies Cardiac Cath - denies  Sleep Study - denies  Aspirin Instructions: N/A  Anesthesia review: No  Patient denies shortness of breath, fever, cough and chest pain at PAT appointment   Patient verbalized understanding of instructions that were given to them at the PAT appointment. Patient was also instructed that they will need to review over the PAT instructions again at home before surgery.

## 2018-02-17 ENCOUNTER — Ambulatory Visit (HOSPITAL_COMMUNITY)
Admission: RE | Admit: 2018-02-17 | Discharge: 2018-02-18 | Disposition: A | Discharge status: Home / Self Care | Payer: BLUE CROSS/BLUE SHIELD | Attending: Specialist | Admitting: Specialist

## 2018-02-17 ENCOUNTER — Encounter (HOSPITAL_COMMUNITY): Payer: Self-pay

## 2018-02-17 ENCOUNTER — Encounter (HOSPITAL_COMMUNITY)
Admission: RE | Disposition: A | Discharge status: Home / Self Care | Payer: Self-pay | Source: Home / Self Care | Attending: Specialist

## 2018-02-17 ENCOUNTER — Other Ambulatory Visit: Payer: Self-pay

## 2018-02-17 ENCOUNTER — Ambulatory Visit (HOSPITAL_COMMUNITY): Payer: BLUE CROSS/BLUE SHIELD | Admitting: Anesthesiology

## 2018-02-17 ENCOUNTER — Ambulatory Visit (HOSPITAL_COMMUNITY): Payer: BLUE CROSS/BLUE SHIELD

## 2018-02-17 DIAGNOSIS — M5116 Intervertebral disc disorders with radiculopathy, lumbar region: Secondary | ICD-10-CM | POA: Diagnosis present

## 2018-02-17 DIAGNOSIS — Z419 Encounter for procedure for purposes other than remedying health state, unspecified: Secondary | ICD-10-CM

## 2018-02-17 DIAGNOSIS — J45909 Unspecified asthma, uncomplicated: Secondary | ICD-10-CM | POA: Insufficient documentation

## 2018-02-17 DIAGNOSIS — Z7951 Long term (current) use of inhaled steroids: Secondary | ICD-10-CM | POA: Insufficient documentation

## 2018-02-17 DIAGNOSIS — M5146 Schmorl's nodes, lumbar region: Secondary | ICD-10-CM | POA: Diagnosis not present

## 2018-02-17 DIAGNOSIS — Z79899 Other long term (current) drug therapy: Secondary | ICD-10-CM | POA: Insufficient documentation

## 2018-02-17 DIAGNOSIS — M5126 Other intervertebral disc displacement, lumbar region: Secondary | ICD-10-CM

## 2018-02-17 DIAGNOSIS — M48061 Spinal stenosis, lumbar region without neurogenic claudication: Secondary | ICD-10-CM | POA: Diagnosis not present

## 2018-02-17 HISTORY — PX: LUMBAR LAMINECTOMY/DECOMPRESSION MICRODISCECTOMY: SHX5026

## 2018-02-17 SURGERY — LUMBAR LAMINECTOMY/DECOMPRESSION MICRODISCECTOMY 1 LEVEL
Anesthesia: General

## 2018-02-17 MED ORDER — ACETAMINOPHEN 10 MG/ML IV SOLN
INTRAVENOUS | Status: AC
  Filled 2018-02-17: qty 100

## 2018-02-17 MED ORDER — MAGNESIUM CITRATE PO SOLN: 1.0000 | Freq: Once | ORAL | Status: DC | PRN

## 2018-02-17 MED ORDER — ACETAMINOPHEN 650 MG RE SUPP: 650.0000 mg | RECTAL | Status: DC | PRN

## 2018-02-17 MED ORDER — RISAQUAD PO CAPS
1.0000 | ORAL_CAPSULE | Freq: Every day | ORAL | Status: DC
  Administered 2018-02-17: 1 via ORAL
  Filled 2018-02-17 (×2): qty 1

## 2018-02-17 MED ORDER — ACETAMINOPHEN 325 MG PO TABS: 650.0000 mg | ORAL_TABLET | ORAL | Status: DC | PRN

## 2018-02-17 MED ORDER — LORATADINE 10 MG PO TABS: 10.0000 mg | ORAL_TABLET | Freq: Every day | ORAL | Status: DC

## 2018-02-17 MED ORDER — ROCURONIUM BROMIDE 50 MG/5ML IV SOSY
PREFILLED_SYRINGE | INTRAVENOUS | Status: DC | PRN
  Administered 2018-02-17: 50 mg via INTRAVENOUS

## 2018-02-17 MED ORDER — ALBUTEROL SULFATE (2.5 MG/3ML) 0.083% IN NEBU: 2.5000 mg | INHALATION_SOLUTION | RESPIRATORY_TRACT | Status: DC | PRN

## 2018-02-17 MED ORDER — VANCOMYCIN HCL IN DEXTROSE 1-5 GM/200ML-% IV SOLN
1000.0000 mg | INTRAVENOUS | Status: AC
  Administered 2018-02-17: 1000 mg via INTRAVENOUS

## 2018-02-17 MED ORDER — PHENYLEPHRINE 40 MCG/ML (10ML) SYRINGE FOR IV PUSH (FOR BLOOD PRESSURE SUPPORT)
PREFILLED_SYRINGE | INTRAVENOUS | Status: DC | PRN
  Administered 2018-02-17: 120 ug via INTRAVENOUS
  Administered 2018-02-17: 40 ug via INTRAVENOUS
  Administered 2018-02-17: 80 ug via INTRAVENOUS

## 2018-02-17 MED ORDER — FENTANYL CITRATE (PF) 100 MCG/2ML IJ SOLN
INTRAMUSCULAR | Status: DC | PRN
  Administered 2018-02-17: 100 ug via INTRAVENOUS
  Administered 2018-02-17: 50 ug via INTRAVENOUS
  Administered 2018-02-17: 100 ug via INTRAVENOUS

## 2018-02-17 MED ORDER — ALUM & MAG HYDROXIDE-SIMETH 200-200-20 MG/5ML PO SUSP: 30.0000 mL | Freq: Four times a day (QID) | ORAL | Status: DC | PRN

## 2018-02-17 MED ORDER — KCL IN DEXTROSE-NACL 20-5-0.45 MEQ/L-%-% IV SOLN: INTRAVENOUS | Status: DC

## 2018-02-17 MED ORDER — ONDANSETRON HCL 4 MG PO TABS: 4.0000 mg | ORAL_TABLET | Freq: Four times a day (QID) | ORAL | Status: DC | PRN

## 2018-02-17 MED ORDER — LORATADINE 10 MG PO TABS
10.0000 mg | ORAL_TABLET | Freq: Every day | ORAL | Status: DC
  Administered 2018-02-18: 10 mg via ORAL
  Filled 2018-02-17: qty 1

## 2018-02-17 MED ORDER — BISACODYL 5 MG PO TBEC: 5.0000 mg | DELAYED_RELEASE_TABLET | Freq: Every day | ORAL | Status: DC | PRN

## 2018-02-17 MED ORDER — MIDAZOLAM HCL 5 MG/5ML IJ SOLN
INTRAMUSCULAR | Status: DC | PRN
  Administered 2018-02-17: 2 mg via INTRAVENOUS

## 2018-02-17 MED ORDER — BUPIVACAINE-EPINEPHRINE (PF) 0.25% -1:200000 IJ SOLN
INTRAMUSCULAR | Status: DC | PRN
  Administered 2018-02-17: 9 mL via PERINEURAL

## 2018-02-17 MED ORDER — OXYCODONE HCL 5 MG PO TABS
10.0000 mg | ORAL_TABLET | ORAL | Status: DC | PRN
  Administered 2018-02-17 – 2018-02-18 (×5): 10 mg via ORAL
  Filled 2018-02-17 (×5): qty 2

## 2018-02-17 MED ORDER — FENTANYL CITRATE (PF) 250 MCG/5ML IJ SOLN
INTRAMUSCULAR | Status: AC
  Filled 2018-02-17: qty 5

## 2018-02-17 MED ORDER — GABAPENTIN 300 MG PO CAPS
300.0000 mg | ORAL_CAPSULE | Freq: Two times a day (BID) | ORAL | Status: DC
  Administered 2018-02-18: 300 mg via ORAL
  Filled 2018-02-17: qty 1

## 2018-02-17 MED ORDER — SODIUM CHLORIDE 0.9 % IV SOLN
INTRAVENOUS | Status: DC | PRN
  Administered 2018-02-17: 11:00:00

## 2018-02-17 MED ORDER — VANCOMYCIN HCL IN DEXTROSE 1-5 GM/200ML-% IV SOLN
INTRAVENOUS | Status: AC
  Administered 2018-02-17: 1000 mg via INTRAVENOUS
  Filled 2018-02-17: qty 200

## 2018-02-17 MED ORDER — DEXAMETHASONE SODIUM PHOSPHATE 10 MG/ML IJ SOLN
INTRAMUSCULAR | Status: AC
  Filled 2018-02-17: qty 1

## 2018-02-17 MED ORDER — OXYCODONE HCL 5 MG PO TABS: 5.0000 mg | ORAL_TABLET | ORAL | Status: DC | PRN

## 2018-02-17 MED ORDER — MENTHOL 3 MG MT LOZG: 1.0000 | LOZENGE | OROMUCOSAL | Status: DC | PRN

## 2018-02-17 MED ORDER — CEFAZOLIN SODIUM-DEXTROSE 2-4 GM/100ML-% IV SOLN
2.0000 g | INTRAVENOUS | Status: AC
  Administered 2018-02-17: 2 g via INTRAVENOUS
  Filled 2018-02-17: qty 100

## 2018-02-17 MED ORDER — HYDROMORPHONE HCL 1 MG/ML IJ SOLN: 0.2500 mg | INTRAMUSCULAR | Status: DC | PRN

## 2018-02-17 MED ORDER — THROMBIN 20000 UNITS EX SOLR
CUTANEOUS | Status: DC | PRN
  Administered 2018-02-17: 11:00:00 via TOPICAL

## 2018-02-17 MED ORDER — ARTIFICIAL TEARS OPHTHALMIC OINT
TOPICAL_OINTMENT | OPHTHALMIC | Status: AC
  Filled 2018-02-17: qty 7

## 2018-02-17 MED ORDER — LACTATED RINGERS IV SOLN
INTRAVENOUS | Status: DC
  Administered 2018-02-17 (×2): via INTRAVENOUS

## 2018-02-17 MED ORDER — SUGAMMADEX SODIUM 200 MG/2ML IV SOLN
INTRAVENOUS | Status: DC | PRN
  Administered 2018-02-17: 200 mg via INTRAVENOUS

## 2018-02-17 MED ORDER — PHENOL 1.4 % MT LIQD: 1.0000 | OROMUCOSAL | Status: DC | PRN

## 2018-02-17 MED ORDER — PROPOFOL 10 MG/ML IV BOLUS
INTRAVENOUS | Status: DC | PRN
  Administered 2018-02-17: 40 mg via INTRAVENOUS
  Administered 2018-02-17: 200 mg via INTRAVENOUS

## 2018-02-17 MED ORDER — LACTATED RINGERS IV SOLN: INTRAVENOUS | Status: DC

## 2018-02-17 MED ORDER — MIDAZOLAM HCL 2 MG/2ML IJ SOLN
INTRAMUSCULAR | Status: AC
  Filled 2018-02-17: qty 2

## 2018-02-17 MED ORDER — LEVOCETIRIZINE DIHYDROCHLORIDE 5 MG PO TABS: 5.0000 mg | ORAL_TABLET | ORAL | Status: DC

## 2018-02-17 MED ORDER — LIDOCAINE 2% (20 MG/ML) 5 ML SYRINGE
INTRAMUSCULAR | Status: AC
  Filled 2018-02-17: qty 5

## 2018-02-17 MED ORDER — METHOCARBAMOL 1000 MG/10ML IJ SOLN
500.0000 mg | Freq: Four times a day (QID) | INTRAVENOUS | Status: DC | PRN
  Filled 2018-02-17: qty 5

## 2018-02-17 MED ORDER — MONTELUKAST SODIUM 10 MG PO TABS
10.0000 mg | ORAL_TABLET | Freq: Every day | ORAL | Status: DC
  Filled 2018-02-17: qty 1

## 2018-02-17 MED ORDER — FLUTICASONE PROPIONATE 50 MCG/ACT NA SUSP
2.0000 | Freq: Two times a day (BID) | NASAL | Status: DC
  Filled 2018-02-17: qty 16

## 2018-02-17 MED ORDER — LIDOCAINE 2% (20 MG/ML) 5 ML SYRINGE
INTRAMUSCULAR | Status: DC | PRN
  Administered 2018-02-17: 60 mg via INTRAVENOUS

## 2018-02-17 MED ORDER — ONDANSETRON HCL 4 MG/2ML IJ SOLN
INTRAMUSCULAR | Status: AC
  Filled 2018-02-17: qty 2

## 2018-02-17 MED ORDER — ROCURONIUM BROMIDE 50 MG/5ML IV SOSY
PREFILLED_SYRINGE | INTRAVENOUS | Status: AC
  Filled 2018-02-17: qty 5

## 2018-02-17 MED ORDER — PROPOFOL 10 MG/ML IV BOLUS
INTRAVENOUS | Status: AC
  Filled 2018-02-17: qty 20

## 2018-02-17 MED ORDER — ACETAMINOPHEN 10 MG/ML IV SOLN
1000.0000 mg | INTRAVENOUS | Status: AC
  Administered 2018-02-17: 1000 mg via INTRAVENOUS

## 2018-02-17 MED ORDER — VANCOMYCIN HCL 10 G IV SOLR
1500.0000 mg | Freq: Once | INTRAVENOUS | Status: AC
  Administered 2018-02-17: 1500 mg via INTRAVENOUS
  Filled 2018-02-17: qty 1500

## 2018-02-17 MED ORDER — BUPIVACAINE-EPINEPHRINE (PF) 0.25% -1:200000 IJ SOLN
INTRAMUSCULAR | Status: AC
  Filled 2018-02-17: qty 30

## 2018-02-17 MED ORDER — DEXAMETHASONE SODIUM PHOSPHATE 4 MG/ML IJ SOLN
INTRAMUSCULAR | Status: DC | PRN
  Administered 2018-02-17: 10 mg via INTRAVENOUS

## 2018-02-17 MED ORDER — METHOCARBAMOL 500 MG PO TABS
500.0000 mg | ORAL_TABLET | Freq: Four times a day (QID) | ORAL | Status: DC | PRN
  Administered 2018-02-17 – 2018-02-18 (×3): 500 mg via ORAL
  Filled 2018-02-17 (×3): qty 1

## 2018-02-17 MED ORDER — 0.9 % SODIUM CHLORIDE (POUR BTL) OPTIME
TOPICAL | Status: DC | PRN
  Administered 2018-02-17: 1000 mL

## 2018-02-17 MED ORDER — DOCUSATE SODIUM 100 MG PO CAPS
100.0000 mg | ORAL_CAPSULE | Freq: Two times a day (BID) | ORAL | Status: DC
  Administered 2018-02-17 – 2018-02-18 (×2): 100 mg via ORAL
  Filled 2018-02-17 (×3): qty 1

## 2018-02-17 MED ORDER — ONDANSETRON HCL 4 MG/2ML IJ SOLN: 4.0000 mg | Freq: Once | INTRAMUSCULAR | Status: DC | PRN

## 2018-02-17 MED ORDER — BUDESONIDE 0.5 MG/2ML IN SUSP
0.5000 mg | Freq: Two times a day (BID) | RESPIRATORY_TRACT | Status: DC
  Filled 2018-02-17 (×2): qty 2

## 2018-02-17 MED ORDER — ONDANSETRON HCL 4 MG/2ML IJ SOLN
INTRAMUSCULAR | Status: DC | PRN
  Administered 2018-02-17: 4 mg via INTRAVENOUS

## 2018-02-17 MED ORDER — THROMBIN 20000 UNITS EX SOLR
CUTANEOUS | Status: AC
  Filled 2018-02-17: qty 20000

## 2018-02-17 MED ORDER — HYDROMORPHONE HCL 1 MG/ML IJ SOLN
1.0000 mg | INTRAMUSCULAR | Status: DC | PRN
  Administered 2018-02-17: 1 mg via INTRAVENOUS
  Filled 2018-02-17: qty 1

## 2018-02-17 MED ORDER — ONDANSETRON HCL 4 MG/2ML IJ SOLN: 4.0000 mg | Freq: Four times a day (QID) | INTRAMUSCULAR | Status: DC | PRN

## 2018-02-17 MED ORDER — POLYETHYLENE GLYCOL 3350 17 G PO PACK: 17.0000 g | PACK | Freq: Every day | ORAL | Status: DC | PRN

## 2018-02-17 SURGICAL SUPPLY — 61 items
BAG DECANTER FOR FLEXI CONT (MISCELLANEOUS) ×3 IMPLANT
CLEANER TIP ELECTROSURG 2X2 (MISCELLANEOUS) ×3 IMPLANT
CLOSURE WOUND 1/2 X4 (GAUZE/BANDAGES/DRESSINGS) ×1
CLOTH 2% CHLOROHEXIDINE 3PK (PERSONAL CARE ITEMS) ×3 IMPLANT
CONT SPEC 4OZ CLIKSEAL STRL BL (MISCELLANEOUS) ×5 IMPLANT
COVER WAND RF STERILE (DRAPES) ×1 IMPLANT
DRAPE LAPAROTOMY 100X72X124 (DRAPES) ×3 IMPLANT
DRAPE MICROSCOPE LEICA (MISCELLANEOUS) ×3 IMPLANT
DRAPE SHEET LG 3/4 BI-LAMINATE (DRAPES) ×3 IMPLANT
DRAPE SURG 17X11 SM STRL (DRAPES) ×3 IMPLANT
DRAPE UTILITY XL STRL (DRAPES) ×3 IMPLANT
DRSG AQUACEL AG ADV 3.5X 4 (GAUZE/BANDAGES/DRESSINGS) IMPLANT
DRSG AQUACEL AG ADV 3.5X 6 (GAUZE/BANDAGES/DRESSINGS) ×2 IMPLANT
DRSG TELFA 3X8 NADH (GAUZE/BANDAGES/DRESSINGS) IMPLANT
DURAPREP 26ML APPLICATOR (WOUND CARE) ×3 IMPLANT
DURASEAL SPINE SEALANT 3ML (MISCELLANEOUS) IMPLANT
ELECT BLADE 4.0 EZ CLEAN MEGAD (MISCELLANEOUS)
ELECT REM PT RETURN 9FT ADLT (ELECTROSURGICAL) ×3
ELECTRODE BLDE 4.0 EZ CLN MEGD (MISCELLANEOUS) IMPLANT
ELECTRODE REM PT RTRN 9FT ADLT (ELECTROSURGICAL) ×1 IMPLANT
GLOVE BIOGEL PI IND STRL 6.5 (GLOVE) IMPLANT
GLOVE BIOGEL PI IND STRL 7.0 (GLOVE) ×1 IMPLANT
GLOVE BIOGEL PI IND STRL 7.5 (GLOVE) IMPLANT
GLOVE BIOGEL PI INDICATOR 6.5 (GLOVE) ×2
GLOVE BIOGEL PI INDICATOR 7.0 (GLOVE) ×12
GLOVE BIOGEL PI INDICATOR 7.5 (GLOVE) ×6
GLOVE SURG SS PI 6.0 STRL IVOR (GLOVE) ×6 IMPLANT
GLOVE SURG SS PI 7.5 STRL IVOR (GLOVE) ×3 IMPLANT
GLOVE SURG SS PI 8.0 STRL IVOR (GLOVE) ×6 IMPLANT
GOWN STRL REUS W/ TWL LRG LVL3 (GOWN DISPOSABLE) ×1 IMPLANT
GOWN STRL REUS W/ TWL XL LVL3 (GOWN DISPOSABLE) ×1 IMPLANT
GOWN STRL REUS W/TWL LRG LVL3 (GOWN DISPOSABLE) ×2
GOWN STRL REUS W/TWL XL LVL3 (GOWN DISPOSABLE) ×8
IV CATH 14GX2 1/4 (CATHETERS) ×3 IMPLANT
KIT BASIN OR (CUSTOM PROCEDURE TRAY) ×3 IMPLANT
KIT POSITION SURG JACKSON T1 (MISCELLANEOUS) ×3 IMPLANT
NDL SPNL 18GX3.5 QUINCKE PK (NEEDLE) ×2 IMPLANT
NEEDLE 22X1 1/2 (OR ONLY) (NEEDLE) ×3 IMPLANT
NEEDLE SPNL 18GX3.5 QUINCKE PK (NEEDLE) ×6 IMPLANT
PACK LAMINECTOMY NEURO (CUSTOM PROCEDURE TRAY) ×3 IMPLANT
PAD DRESSING TELFA 3X8 NADH (GAUZE/BANDAGES/DRESSINGS) IMPLANT
PATTIES SURGICAL .5 X.5 (GAUZE/BANDAGES/DRESSINGS) ×2 IMPLANT
PATTIES SURGICAL .75X.75 (GAUZE/BANDAGES/DRESSINGS) ×3 IMPLANT
RUBBERBAND STERILE (MISCELLANEOUS) ×6 IMPLANT
SPONGE LAP 4X18 RFD (DISPOSABLE) IMPLANT
SPONGE SURGIFOAM ABS GEL 100 (HEMOSTASIS) ×3 IMPLANT
STAPLER VISISTAT (STAPLE) IMPLANT
STRIP CLOSURE SKIN 1/2X4 (GAUZE/BANDAGES/DRESSINGS) ×2 IMPLANT
SUT NURALON 4 0 TR CR/8 (SUTURE) IMPLANT
SUT PROLENE 3 0 PS 2 (SUTURE) ×2 IMPLANT
SUT VIC AB 1 CT1 27 (SUTURE)
SUT VIC AB 1 CT1 27XBRD ANTBC (SUTURE) IMPLANT
SUT VIC AB 1-0 CT2 27 (SUTURE) ×2 IMPLANT
SUT VIC AB 2-0 CT1 27 (SUTURE) ×2
SUT VIC AB 2-0 CT1 TAPERPNT 27 (SUTURE) IMPLANT
SUT VIC AB 2-0 CT2 27 (SUTURE) IMPLANT
SYR 3ML LL SCALE MARK (SYRINGE) ×3 IMPLANT
TOWEL GREEN STERILE (TOWEL DISPOSABLE) ×3 IMPLANT
TOWEL GREEN STERILE FF (TOWEL DISPOSABLE) ×3 IMPLANT
TRAY CATH 16FR W/PLASTIC CATH (SET/KITS/TRAYS/PACK) ×2 IMPLANT
YANKAUER SUCT BULB TIP NO VENT (SUCTIONS) ×3 IMPLANT

## 2018-02-17 NOTE — Interval H&P Note (Signed)
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 After consideration of risks, benefits and other options for treatment, the patient has consented to  Procedure(s) with comments: Microlumbar decompression L4-5 (N/A) - 120 mins as a surgical intervention .  The patient's history has been reviewed, patient examined, no change in status, stable for surgery.  I have reviewed the patient's chart and labs.  Questions were answered to the patient's satisfaction.     Javier DockerJeffrey C Phenix Vandermeulen

## 2018-02-17 NOTE — Anesthesia Postprocedure Evaluation (Signed)
Anesthesia Post Note  Patient: [name] Tran  Procedure(s) Performed: Microlumbar decompression Lumbar Four-Five (N/A )     Patient location during evaluation: PACU Anesthesia Type: General Level of consciousness: awake and alert Pain management: pain level controlled Vital Signs Assessment: post-procedure vital signs reviewed and stable Respiratory status: spontaneous breathing, nonlabored ventilation, respiratory function stable and patient connected to nasal cannula oxygen Cardiovascular status: blood pressure returned to baseline and stable Postop Assessment: no apparent nausea or vomiting Anesthetic complications: no    Last Vitals:  Vitals:   02/17/18 1445 02/17/18 1516  BP:  126/85  Pulse: 70 65  Resp: 17 16  Temp:  36.4 C  SpO2: 96% 98%    Last Pain:  Vitals:   02/17/18 1516  TempSrc: Oral  PainSc: 2       LLE Sensation: (P) Numbness(numbness in left toe, same as before surgery) (02/17/18 1542)   RLE Sensation: (P) Full sensation (02/17/18 1542)      Kennieth Rad

## 2018-02-17 NOTE — Anesthesia Preprocedure Evaluation (Signed)
Anesthesia Evaluation  Patient identified by MRN, date of birth, ID band Patient awake    Reviewed: Allergy & Precautions, NPO status , Patient's Chart, lab work & pertinent test results  Airway Mallampati: II  TM Distance: >3 FB Neck ROM: Full    Dental  (+) Dental Advisory Given   Pulmonary asthma ,    breath sounds clear to auscultation       Cardiovascular negative cardio ROS   Rhythm:Regular Rate:Normal     Neuro/Psych negative neurological ROS     GI/Hepatic negative GI ROS, Neg liver ROS,   Endo/Other  negative endocrine ROS  Renal/GU negative Renal ROS     Musculoskeletal   Abdominal   Peds  Hematology negative hematology ROS (+)   Anesthesia Other Findings   Reproductive/Obstetrics                             Lab Results  Component Value Date   WBC 7.1 02/15/2018   HGB 15.8 02/15/2018   HCT 47.8 02/15/2018   MCV 90.7 02/15/2018   PLT 214 02/15/2018   Lab Results  Component Value Date   CREATININE 1.00 02/15/2018   BUN 9 02/15/2018   NA 139 02/15/2018   K 4.0 02/15/2018   CL 103 02/15/2018   CO2 24 02/15/2018    Anesthesia Physical Anesthesia Plan  ASA: II  Anesthesia Plan: General   Post-op Pain Management:    Induction: Intravenous  PONV Risk Score and Plan: 2 and Dexamethasone, Ondansetron and Treatment may vary due to age or medical condition  Airway Management Planned: Oral ETT  Additional Equipment:   Intra-op Plan:   Post-operative Plan: Extubation in OR  Informed Consent: I have reviewed the patients History and Physical, chart, labs and discussed the procedure including the risks, benefits and alternatives for the proposed anesthesia with the patient or authorized representative who has indicated his/her understanding and acceptance.   Dental advisory given  Plan Discussed with: CRNA  Anesthesia Plan Comments:         Anesthesia  Quick Evaluation

## 2018-02-17 NOTE — Progress Notes (Signed)
Pharmacy Antibiotic Note  Lexis Prenatt is a 33 y.o. male admitted on 02/17/2018 for surgery.  Now s/p microlumbar decompression.  Pharmacy has been consulted for for 1 dose 12 hours post-op unless patient has a drain, then continue vancomycin until discontinued by physician..  No drain, confirmed with nurse.  Preop vancomycin 1000mg  IV given.   Plan: Vancomycin 1500 mg IV x1 at 2000 tonight.  Pharmacy will sign off.  Height: 5\' 8"  (172.7 cm) Weight: 195 lb 8 oz (88.7 kg) IBW/kg (Calculated) : 68.4  Temp (24hrs), Avg:97.6 F (36.4 C), Min:97.3 F (36.3 C), Max:97.8 F (36.6 C)  Recent Labs  Lab 02/13/18 0558 02/15/18 0834  WBC 17.5* 7.1  CREATININE 1.10 1.00    Estimated Creatinine Clearance: 113.7 mL/min (by C-G formula based on SCr of 1 mg/dL).    Allergies  Allergen Reactions  . Peanuts [Peanut Oil] Other (See Comments)    Tree nuts   Thank you for allowing pharmacy to be a part of this patient's care. Noah Delaine, RPh Clinical Pharmacist Please check AMION for all [facility] Pharm[facility] phone numbers After 10:00 PM, call Main Pharmacy [phone] 02/17/2018 4:13 PM

## 2018-02-17 NOTE — Anesthesia Procedure Notes (Signed)
Procedure Name: Intubation Date/Time: 02/17/2018 10:45 AM Performed by: Caren Macadam, CRNA Pre-anesthesia Checklist: Patient identified, Emergency Drugs available, Suction available and Patient being monitored Patient Re-evaluated:Patient Re-evaluated prior to induction Oxygen Delivery Method: Circle system utilized Preoxygenation: Pre-oxygenation with 100% oxygen Induction Type: IV induction Ventilation: Mask ventilation without difficulty Laryngoscope Size: Miller and 2 Grade View: Grade I Tube type: Oral Tube size: 7.5 mm Number of attempts: 1 Airway Equipment and Method: Stylet and Oral airway Placement Confirmation: ETT inserted through vocal cords under direct vision,  positive ETCO2 and breath sounds checked- equal and bilateral Secured at: 23 cm Tube secured with: Tape Dental Injury: Teeth and Oropharynx as per pre-operative assessment

## 2018-02-17 NOTE — Evaluation (Signed)
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  Clinical Impression  Patient presents s/p above procedure and now needing to follow back precautions.  Mobilizing well and able to verbalize precautions with handout given.  Encouraged continued mobility with family support and gradual increase in activity level.  No further skilled PT needs at this time.  Will sign off.     Follow Up Recommendations No PT follow up    Equipment Recommendations  None recommended by PT    Recommendations for Other Services       Precautions / Restrictions Precautions Precautions: Back Precaution Booklet Issued: Yes (comment) Restrictions Weight Bearing Restrictions: No      Mobility  Bed Mobility Overal bed mobility: Needs Assistance Bed Mobility: Rolling;Sidelying to Sit Rolling: Supervision Sidelying to sit: Supervision       General bed mobility comments: cues for technique  Transfers Overall transfer level: Modified independent                  Ambulation/Gait Ambulation/Gait assistance: Independent Gait Distance (Feet): 200 Feet Assistive device: None Gait Pattern/deviations: Step-through pattern;Decreased dorsiflexion - left     General Gait Details: Educated on walking for exercise at least once more today and prior to d/c in am.  Discussed stairs one at a time with railing and need for gradual increase in activity level  Stairs            Wheelchair Mobility    Modified Rankin (Stroke Patients Only)       Balance                                             Pertinent Vitals/Pain Pain Assessment: 0-10 Pain Score: 8  Pain Location: surgical site Pain Descriptors / Indicators: Sore Pain Intervention(s): Monitored during  session;Premedicated before session    Home Living Family/patient expects to be discharged to:: Private residence Living Arrangements: Spouse/significant other Available Help at Discharge: Family Type of Home: House Home Access: Level entry     Home Layout: Multi-level Home Equipment: None      Prior Function Level of Independence: Independent         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     Hand Dominance        Extremity/Trunk Assessment   Upper Extremity Assessment Upper Extremity Assessment: Overall WFL for tasks assessed    Lower Extremity Assessment Lower Extremity Assessment: LLE deficits/detail LLE Deficits / Details: EHL and ankle DF weakness 3+/5, numbness dorsum of foot and lateral lower leg       Communication   Communication: No difficulties  Cognition Arousal/Alertness: Awake/alert Behavior During Therapy: WFL for tasks assessed/performed Overall Cognitive Status: Within Functional Limits for tasks assessed                                        General Comments General comments (skin integrity, edema, etc.): Discussed precautions with handout given and spouse and mother present.    Exercises     Assessment/Plan    PT Assessment Patent does not need any further PT services  PT  Problem List         PT Treatment Interventions      PT Goals (Current goals can be found in the Care Plan section)  Acute Rehab PT Goals PT Goal Formulation: All assessment and education complete, DC therapy    Frequency     Barriers to discharge        Co-evaluation               AM-PAC PT "6 Clicks" Mobility  Outcome Measure Help needed turning from your back to your side while in a flat bed without using bedrails?: A Little Help needed moving from lying on your back to sitting on the side of a flat bed without using bedrails?: A Little Help needed moving to and from a bed to a chair (including a wheelchair)?:  None Help needed standing up from a chair using your arms (e.g., wheelchair or bedside chair)?: None Help needed to walk in hospital room?: None Help needed climbing 3-5 steps with a railing? : None 6 Click Score: 22    End of Session Equipment Utilized During Treatment: Gait belt Activity Tolerance: Patient tolerated treatment well Patient left: in bed;with family/visitor present;with call bell/phone within reach   PT Visit Diagnosis: Difficulty in walking, not elsewhere classified (R26.2)    Time: [id number] PT Time Calculation (min) (ACUTE ONLY): 21 min   Charges:   PT Evaluation $PT Eval Low Complexity: 1 Low          Sheran LawlessCyndi Yesika Rispoli, [place] CarolinaPT Acute Rehabilitation Services [phone] 02/17/2018   Elray Mcgregorynthia Daryan Buell 02/17/2018, 5:15 PM

## 2018-02-17 NOTE — Transfer of Care (Signed)
Immediate Anesthesia Transfer of Care Note  Patient: [name] Tran  Procedure(s) Performed: Microlumbar decompression Lumbar Four-Five (N/A )  Patient Location: PACU  Anesthesia Type:General  Level of Consciousness: awake, alert  and oriented  Airway & Oxygen Therapy: Patient Spontanous Breathing and Patient connected to face mask oxygen  Post-op Assessment: Report given to RN and Post -op Vital signs reviewed and stable  Post vital signs: Reviewed and stable  Last Vitals:  Vitals Value Taken Time  BP 115/71 02/17/2018  1:52 PM  Temp    Pulse 79 02/17/2018  1:54 PM  Resp 13 02/17/2018  1:54 PM  SpO2 98 % 02/17/2018  1:54 PM  Vitals shown include unvalidated device data.  Last Pain:  Vitals:   02/17/18 0810  TempSrc:   PainSc: 4       Patients Stated Pain Goal: 2 (02/17/18 0810)  Complications: No apparent anesthesia complications

## 2018-02-17 NOTE — Discharge Instructions (Signed)
Walk As Tolerated utilizing back precautions.  No bending, twisting, or lifting.  No driving for 2 weeks.   °Aquacel dressing may remain in place until follow up. May shower with aquacel dressing in place. If the dressing peels off or becomes saturated, you may remove aquacel dressing and place gauze and tape dressing which should be kept clean and dry and changed daily. Do not remove steri-strips if they are present. °See Dr. Katerine Morua in office in 10 to 14 days. Begin taking aspirin 81mg per day starting 4 days after your surgery if not allergic to aspirin or on another blood thinner. °Walk daily even outside. Use a cane or walker only if necessary. °Avoid sitting on soft sofas. ° °

## 2018-02-18 ENCOUNTER — Encounter (HOSPITAL_COMMUNITY): Payer: Self-pay | Admitting: Specialist

## 2018-02-18 DIAGNOSIS — M5116 Intervertebral disc disorders with radiculopathy, lumbar region: Secondary | ICD-10-CM | POA: Diagnosis not present

## 2018-02-18 LAB — BASIC METABOLIC PANEL
Anion gap: 8 (ref 5–15)
BUN: 7 mg/dL (ref 6–20)
CALCIUM: 8.5 mg/dL — AB (ref 8.9–10.3)
CO2: 27 mmol/L (ref 22–32)
Chloride: 104 mmol/L (ref 98–111)
Creatinine, Ser: 0.95 mg/dL (ref 0.61–1.24)
GFR calc Af Amer: >60 mL/min (ref >60)
GFR calc non Af Amer: >60 mL/min (ref >60)
Glucose, Bld: 108 mg/dL — ABNORMAL HIGH (ref 70–99)
Potassium: 3.4 mmol/L — ABNORMAL LOW (ref 3.5–5.1)
Sodium: 139 mmol/L (ref 135–145)

## 2018-02-18 MED ORDER — OXYCODONE HCL 5 MG PO TABS: 5.0000 mg | ORAL_TABLET | ORAL | 0 refills | Status: DC | PRN

## 2018-02-18 MED ORDER — DOCUSATE SODIUM 100 MG PO CAPS: 100.0000 mg | ORAL_CAPSULE | Freq: Two times a day (BID) | ORAL | 1 refills | Status: DC

## 2018-02-18 MED ORDER — POLYETHYLENE GLYCOL 3350 17 G PO PACK: 17.0000 g | PACK | Freq: Every day | ORAL | 0 refills | Status: DC | PRN

## 2018-02-18 MED ORDER — METHOCARBAMOL 500 MG PO TABS: 500.0000 mg | ORAL_TABLET | Freq: Four times a day (QID) | ORAL | 1 refills | Status: DC | PRN

## 2018-02-18 NOTE — Discharge Summary (Signed)
Physician Discharge Summary   Patient ID: [name] Tran MRN: [id number] DOB/AGE: 34/10/1985 34 y.o.  Admit date: 02/17/2018 Discharge date: 02/18/2018  Primary Diagnosis:   Herniated nucleus pulposus Lumbar Four -Five  Admission Diagnoses:  Past Medical History:  Diagnosis Date  . Asthma   . Environmental allergies   . Seasonal allergies    Discharge Diagnoses:   Principal Problem:   HNP (herniated nucleus pulposus), lumbar Active Problems:   Spinal stenosis, lumbar  Procedure:  Procedure(s) (LRB): Microlumbar decompression Lumbar Four-Five (N/A)   Consults: None  HPI:  see H&P    Laboratory Data: Hospital Outpatient Visit on 02/15/2018  Component Date Value Ref Range Status  . Sodium 02/15/2018 139  135 - 145 mmol/L Final  . Potassium 02/15/2018 4.0  3.5 - 5.1 mmol/L Final  . Chloride 02/15/2018 103  98 - 111 mmol/L Final  . CO2 02/15/2018 24  22 - 32 mmol/L Final  . Glucose, Bld 02/15/2018 111* 70 - 99 mg/dL Final  . BUN 78/29/562101/08/2018 9  6 - 20 mg/dL Final  . Creatinine, Ser 02/15/2018 1.00  0.61 - 1.24 mg/dL Final  . Calcium 30/86/578401/08/2018 9.0  8.9 - 10.3 mg/dL Final  . GFR calc non Af Amer 02/15/2018 >60  >60 mL/min Final  . GFR calc Af Amer 02/15/2018 >60  >60 mL/min Final  . Anion gap 02/15/2018 12  5 - 15 Final   Performed at [facility], [address] [place], [place] [address]  . WBC 02/15/2018 7.1  4.0 - 10.5 K/uL Final  . RBC 02/15/2018 5.27  4.22 - 5.81 MIL/uL Final  . Hemoglobin 02/15/2018 15.8  13.0 - 17.0 g/dL Final  . HCT 52/84/132401/08/2018 47.8  39.0 - 52.0 % Final  . MCV 02/15/2018 90.7  80.0 - 100.0 fL Final  . MCH 02/15/2018 30.0  26.0 - 34.0 pg Final  . MCHC 02/15/2018 33.1  30.0 - 36.0 g/dL Final  . RDW 40/10/272501/08/2018 12.1  11.5 - 15.5 % Final  . Platelets 02/15/2018 214  150 - 400 K/uL Final  . nRBC 02/15/2018 0.0  0.0 - 0.2 % Final   Performed at [facility], [address] [place], [place] [address]  . MRSA, PCR 02/15/2018 POSITIVE*  NEGATIVE Final   Comment: RESULT CALLED TO, READ BACK BY AND VERIFIED WITH: Elaina PatteeM. Newell RN 10:45 02/15/18 ([email or web address])   . Staphylococcus aureus 02/15/2018 POSITIVE* NEGATIVE Final   Comment: (NOTE) The Xpert SA Assay (FDA approved for NASAL specimens in patients 34 years of age and older), is one component of a comprehensive surveillance program. It is not intended to diagnose infection nor to guide or monitor treatment. Performed at [facility], [address] [place], [place] [address]    Recent Labs    02/15/18 0834  HGB 15.8   Recent Labs    02/15/18 0834  WBC 7.1  RBC 5.27  HCT 47.8  PLT 214   Recent Labs    02/15/18 0834 02/18/18 0648  NA 139 139  K 4.0 3.4*  CL 103 104  CO2 24 27  BUN 9 7  CREATININE 1.00 0.95  GLUCOSE 111* 108*  CALCIUM 9.0 8.5*   No results for input(s): LABPT, INR in the last 72 hours.  X-Rays:Dg Lumbar Spine 2-3 Views  Result Date: 02/17/2018 CLINICAL DATA:  Decompression at L4-5 EXAM: LUMBAR SPINE - 2-3 VIEW COMPARISON:  Lumbar spine films 05/27/2018 FINDINGS: Portable cross-table lateral view of the lumbar spine shows 2  needles for localization. The more cephalad needle is just beneath the spinous process of L5 directed toward the L5-S1 interspace. The second needle is directed toward the posterior aspect of S1 and S2 vertebral bodies. IMPRESSION: Needles for localization as described above. Electronically Signed   By: Dwyane Dee M.D.   On: 02/17/2018 14:01   Dg Lumbar Spine 2-3 Views  Result Date: 02/15/2018 CLINICAL DATA:  Preop lumbar surgery EXAM: LUMBAR SPINE - 2-3 VIEW COMPARISON:  None. FINDINGS: Degenerative spurring anteriorly in the mid lumbar spine. Slight rightward scoliosis in the mid lumbar spine. No fracture or subluxation. SI joints are symmetric and unremarkable. Five lumbar type vertebral bodies. IMPRESSION: Slight rightward scoliosis and early degenerative changes. No acute findings. Electronically Signed   By: Charlett Nose M.D.   On: 02/15/2018 09:04    EKG:No orders found for this or any previous visit.   Hospital Course: Patient was admitted to [facility] and taken to the OR and underwent the above state procedure without complications.  Patient tolerated the procedure well and was later transferred to the recovery room and then to the orthopaedic floor for postoperative care.  They were given PO and IV analgesics for pain control following their surgery.  They were given 24 hours of postoperative antibiotics.   PT was consulted postop to assist with mobility and transfers.  The patient was allowed to be WBAT with therapy and was taught back precautions. Discharge planning was consulted to help with postop disposition and equipment needs.  Patient had a good night on the evening of surgery and started to get up OOB with therapy on day one. Patient was seen in rounds and was ready to go home on day one.  They were given discharge instructions and dressing directions.  They were instructed on when to follow up in the office with Dr. Shelle Iron.   Diet: Regular diet Activity:WBAT, Lspine precautions Follow-up:in 10-14 days Disposition - Home Discharged Condition: good   Discharge Instructions    Call MD / Call 911   Complete by:  As directed    If you experience chest pain or shortness of breath, CALL 911 and be transported to the hospital emergency room.  If you develope a fever above 101 F, pus (white drainage) or increased drainage or redness at the wound, or calf pain, call your surgeon's office.   Constipation Prevention   Complete by:  As directed    Drink plenty of fluids.  Prune juice may be helpful.  You may use a stool softener, such as Colace (over the counter) 100 mg twice a day.  Use MiraLax (over the counter) for constipation as needed.   Diet - low sodium heart healthy   Complete by:  As directed    Increase activity slowly as tolerated   Complete by:  As directed      Allergies as  of 02/18/2018      Reactions   Peanuts [peanut Oil] Other (See Comments)   Tree nuts      Medication List    TAKE these medications   albuterol 108 (90 Base) MCG/ACT inhaler Commonly known as:  PROVENTIL HFA;VENTOLIN HFA Inhale 2 puffs into the lungs every 4 (four) hours as needed for wheezing or shortness of breath.   AUVI-Q 0.3 mg/0.3 mL Soaj injection Generic drug:  EPINEPHrine Inject 0.3 mg into the muscle once.   cetirizine 10 MG tablet Commonly known as:  ZYRTEC Take 10 mg by mouth daily as needed  for allergies.   docusate sodium 100 MG capsule Commonly known as:  COLACE Take 1 capsule (100 mg total) by mouth 2 (two) times daily.   fexofenadine 180 MG tablet Commonly known as:  ALLEGRA Take 180 mg by mouth daily as needed for allergies.   fluticasone 110 MCG/ACT inhaler Commonly known as:  FLOVENT HFA Inhale 2 puffs into the lungs 2 (two) times daily.   Fluticasone Propionate 93 MCG/ACT Exhu Commonly known as:  XHANCE Place 2 sprays into the nose 2 (two) times daily.   gabapentin 300 MG capsule Commonly known as:  NEURONTIN Take 300 mg by mouth 2 (two) times daily.   levocetirizine 5 MG tablet Commonly known as:  XYZAL TAKE ONE TABLET BY MOUTH EVERY EVENING What changed:  when to take this   methocarbamol 500 MG tablet Commonly known as:  ROBAXIN Take 1 tablet (500 mg total) by mouth every 6 (six) hours as needed for muscle spasms.   montelukast 10 MG tablet Commonly known as:  SINGULAIR Take 1 tablet (10 mg total) by mouth at bedtime.   oxyCODONE 5 MG immediate release tablet Commonly known as:  Oxy IR/ROXICODONE Take 1-2 tablets (5-10 mg total) by mouth every 4 (four) hours as needed for moderate pain ((score 4 to 6)).   polyethylene glycol packet Commonly known as:  MIRALAX / GLYCOLAX Take 17 g by mouth daily as needed for mild constipation.   promethazine 25 MG tablet Commonly known as:  PHENERGAN Take 1 tablet (25 mg total) by mouth every 8  (eight) hours as needed for nausea or vomiting.      Follow-up Information    Jene Every, MD Follow up in 2 week(s).   Specialty:  Orthopedic Surgery Contact information: [address] [place] [address] [phone]           Signed: Andrez Grime, PA-C Orthopaedic Surgery 02/18/2018, 8:26 AM

## 2018-02-18 NOTE — Progress Notes (Signed)
OT Screen Note  Patient Details Name: [name] Tran MRN: [id number] DOB: 09-11-1985   Cancelled Treatment:    Reason Eval/Treat Not Completed: OT screened, no needs identified, will sign off  Fontaine No, OTR/L  Acute Rehabilitation Services Pager: [phone] Office: [phone] .  02/18/2018, 8:49 AM

## 2018-02-18 NOTE — Progress Notes (Signed)
Discharge education complete. Meds, diet, activity, follow up appointments and incisional care reviewed and all questions answered. Copy of instructions and prescriptions given to patient. Patient being discharged home with mother.

## 2018-02-18 NOTE — Care Management Note (Signed)
Case Management Note  Patient Details  Name: Windle Goncalves MRN: [id number] Date of Birth: 16-May-1985  Subjective/Objective:                    Action/Plan: Pt discharging with self care. No f/u per PT and no DME needs. CM signing off.  Expected Discharge Date:  02/18/18               Expected Discharge Plan:  Home/Self Care  In-House Referral:     Discharge planning Services     Post Acute Care Choice:    Choice offered to:     DME Arranged:    DME Agency:     HH Arranged:    HH Agency:     Status of Service:  Completed, signed off  If discussed at [employer] of Stay Meetings, dates discussed:    Additional Comments:  Kermit Balo, RN 02/18/2018, 10:23 AM

## 2018-02-18 NOTE — Progress Notes (Signed)
Subjective: 1 Day Post-Op Procedure(s) (LRB): Microlumbar decompression Lumbar Four-Five (N/A) Patient reports pain as mild.  No nerve pain. Some incisional back pain. Numb and weak in L great toe but does feel strength has already improved. Voiding without difficulty. No BM but +flatus. No other c/o. Ready for D/C  Objective: Vital signs in last 24 hours: Temp:  [97.3 F (36.3 C)-98.3 F (36.8 C)] 98.1 F (36.7 C) (01/10 0727) Pulse Rate:  [61-82] 61 (01/10 0727) Resp:  [13-22] 18 (01/10 0727) BP: (115-147)/(69-92) 133/83 (01/10 0727) SpO2:  [95 %-100 %] 97 % (01/10 0400)  Intake/Output from previous day: 01/09 0701 - 01/10 0700 In: 1940 [P.O.:440; I.V.:1500] Out: 750 [Urine:700; Blood:50] Intake/Output this shift: No intake/output data recorded.  Recent Labs    02/15/18 0834  HGB 15.8   Recent Labs    02/15/18 0834  WBC 7.1  RBC 5.27  HCT 47.8  PLT 214   Recent Labs    02/15/18 0834 02/18/18 0648  NA 139 139  K 4.0 3.4*  CL 103 104  CO2 24 27  BUN 9 7  CREATININE 1.00 0.95  GLUCOSE 111* 108*  CALCIUM 9.0 8.5*   No results for input(s): LABPT, INR in the last 72 hours.  Neurologically intact ABD soft Neurovascular intact Sensation intact distally Intact pulses distally Dorsiflexion/Plantar flexion intact Incision: dressing C/D/I and no drainage No cellulitis present Compartment soft no calf pain or sign of DVT  Assessment/Plan: 1 Day Post-Op Procedure(s) (LRB): Microlumbar decompression Lumbar Four-Five (N/A) Advance diet Up with therapy D/C IV fluids  Discussed D/C instructions, Lspine precautions, dressing instructions DC home today Will discuss with Dr Shelle Iron Follow up in 10-14 days for suture removal    [name] Tran 02/18/2018, 8:23 AM

## 2018-02-27 NOTE — Op Note (Signed)
NAME: [name] Tran Tran, [name] Tran MEDICAL RECORD ZO:[id number] ACCOUNT [id number] DATE OF BIRTH:Mar 03, 1985 FACILITY: [facility] LOCATION: [id number] PHYSICIAN:Lylla Eifler Connye Burkitt. Butler Vegh, MD  OPERATIVE REPORT  DATE OF PROCEDURE:  02/17/2018  PREOPERATIVE DIAGNOSIS:  Herniated nucleus pulposus, spinal stenosis L4-5, left.  POSTOPERATIVE DIAGNOSIS:  Herniated nucleus pulposus, spinal stenosis L4-5, left.  PROCEDURE PERFORMED: 1.  Microlumbar decompression L4-5, left. 2.  Foraminotomy L4-L5, left. 3.  Microdiskectomy L4-5, left.  ANESTHESIA:  General.  ASSISTANT:  Andrez GrimeJaclyn Bissell, PA  HISTORY:  This is a pleasant 33 year old male with severe L5 radiculopathy secondary to disk herniation, a large L4-L5 caudad compressing the L5 nerve root had a mild scoliosis and stenosis noted due to facet hypertrophy with EHL weakness and neuro  tension signs.  He was indicated for microlumbar decompression L4-5.  Risks and benefits discussed including bleeding, infection, damage to neurovascular structures, no change in symptoms, worsening symptoms, DVT, PE, anesthetic complications, etc.  DESCRIPTION OF PROCEDURE:  With the patient in supine position after induction of adequate general anesthesia 2 grams Kefzol and vancomycin, he was placed prone on the Wilson frame.  Lumbar region was prepped and draped in the usual sterile fashion.  Two  18-gauge spinal needles were utilized to localize the 4-5 interspace, confirmed with x-ray.  Incision was made from the spinous process of 4-5.  Subcutaneous tissue was dissected with electrocautery to achieve hemostasis.  Marcaine 0.25% with  epinephrine was infiltrated in the paraspinous musculature for hemostasis.  After the dorsal lumbar fascia was divided in line with skin incision, paraspinous muscle elevated from the lamina 4-5.  McCullough retractor was placed.  Operating microscope  was draped and brought in the surgical field.  A very small interlaminar window was noted at 4-5 due to facet  hypertrophy and scoliosis.  I first began a hemilaminotomy of the caudad edge of 4 with a 2 mm Kerrison continuing cephalad, preserving the  pars.  I then removed a portion of the lateral interspinous ligament.  I utilized a micro curette to detach ligamentum flavum from the cephalad edge of 5.  Following this, with a micro curette, we detached ligamentum flavum from the cephalad edge of 5.   We began a hemilaminotomy of that lamina.  The superior articulating process of 4 was identified.  I used a micro curette to develop a plane between the ligamentum flavum, the superior articulating facet medially and cephalad.  We then used a Penfield #4  then to separate this to protect the neural element.  Severe stenosis noted in the lateral recess.  We chose to perform a generous foraminotomy of L5.  Then, we identified the L5 nerve root, which was compressed there and with ligamentum and a small  interlaminar space with the facet hypertrophy, began cephalad decompressing the lateral recess to the medial border of the pedicle.  Performing a foraminotomy of 4, preserving the pars.  Ligamentum flavum removed from the interspace.  There was  hypertrophic venous plexus noted as well tethering the root.  I gently mobilized the nerve root medially and a large HNP was noted cephalad.  It had been displaced and the 5 root and up cephalad to the 4 root.  With the 4 root and the 5 root protected, I  performed an annulotomy and copious portion of disk material was removed from the disk space as well as from this extruded fragment.  There was hypertrophic venous plexus.  This was cauterized.  Bone wax placed in the cancellous surfaces after multiple  passes beneath the thecal sac.  Multiple fragments were obtained.  This decompressed the 5 root satisfactory.  There was 1 cm of excursion of the 5 root medially in the pedicle without tension.  Then, a neuro probe passed freely out the foramen of 5  below the pedicle of 5 and  above the pedicle of 4 without tension.  We checked beneath the thecal sac.  The axilla of the root, the shoulder of the root, the foramen of 5 and caudad from the disk space there was no residual disk fragments.  We irrigated  the disk space with antibiotic irrigation and then retrieved additional fragments by micropituitary.  The disk space had been further mobilized with an Epstein as well for disk removal.  Again, no further disk herniation was noted and we felt the  decompression was satisfactory.  Initially thrombin-soaked Gelfoam was placed and then removed.  Inspection revealed no evidence of CSF leakage or active bleeding.  We then removed the [facility] retractor, irrigated the paraspinous musculature is no  active bleeding, closed the dorsal fascia with #1 Vicryl, subcutaneous with 2-0 and skin with Prolene.  Sterile dressing applied.  Placed supine on the hospital bed, extubated without difficulty and transported to the recovery room in satisfactory  condition.  The patient tolerated the procedure well.  No complications.  Assistant was [employer], [place].  Minimal blood loss 50 mL.  Specimen to pathology.  TN/NUANCE  D:02/27/2018 T:02/27/2018 JOB:[id number]

## 2018-02-27 NOTE — Brief Op Note (Signed)
02/17/2018  9:26 AM  PATIENT:  [name] Tran  33 y.o. male  PRE-OPERATIVE DIAGNOSIS:  Herniated nucleus pulposus Lumbar Four -Five  POST-OPERATIVE DIAGNOSIS:  Herniated nucleus pulposus Lumbar Four -Five  PROCEDURE:  Procedure(s) with comments: Microlumbar decompression Lumbar Four-Five (N/A) - Microlumbar decompression Lumbar Four-Five  SURGEON:  Surgeon(s) and Role:    Jene Every, MD - Primary  PHYSICIAN ASSISTANT:   ASSISTANTS: Bissell   ANESTHESIA:   general  EBL:  50 mL   BLOOD ADMINISTERED:   DRAINS: none   LOCAL MEDICATIONS USED:  MARCAINE     SPECIMEN:  Source of Specimen:  L45  DISPOSITION OF SPECIMEN:  PATHOLOGY  COUNTS:  YES  TOURNIQUET:  * No tourniquets in log *  DICTATION: .[id number]  PLAN OF CARE: Admit for overnight observation  PATIENT DISPOSITION:  PACU - hemodynamically stable.   Delay start of Pharmacological VTE agent (>24hrs) due to surgical blood loss or risk of bleeding: yes

## 2018-04-15 ENCOUNTER — Other Ambulatory Visit: Payer: Self-pay | Admitting: Allergy and Immunology

## 2018-04-15 DIAGNOSIS — J3089 Other allergic rhinitis: Secondary | ICD-10-CM

## 2018-05-05 ENCOUNTER — Other Ambulatory Visit: Payer: Self-pay

## 2018-05-05 DIAGNOSIS — J3089 Other allergic rhinitis: Secondary | ICD-10-CM

## 2018-05-05 DIAGNOSIS — H1013 Acute atopic conjunctivitis, bilateral: Secondary | ICD-10-CM

## 2018-05-05 MED ORDER — LEVOCETIRIZINE DIHYDROCHLORIDE 5 MG PO TABS: 5.0000 mg | ORAL_TABLET | Freq: Every evening | ORAL | 2 refills | Status: DC

## 2018-05-06 ENCOUNTER — Other Ambulatory Visit: Payer: Self-pay

## 2018-05-10 ENCOUNTER — Telehealth: Payer: Self-pay | Admitting: *Deleted

## 2018-05-10 ENCOUNTER — Other Ambulatory Visit: Payer: Self-pay

## 2018-05-10 ENCOUNTER — Other Ambulatory Visit: Payer: Self-pay | Admitting: Allergy and Immunology

## 2018-05-10 DIAGNOSIS — J4540 Moderate persistent asthma, uncomplicated: Secondary | ICD-10-CM

## 2018-05-10 DIAGNOSIS — J3089 Other allergic rhinitis: Secondary | ICD-10-CM

## 2018-05-10 DIAGNOSIS — H1013 Acute atopic conjunctivitis, bilateral: Secondary | ICD-10-CM

## 2018-05-10 MED ORDER — MONTELUKAST SODIUM 10 MG PO TABS: 10.0000 mg | ORAL_TABLET | Freq: Every day | ORAL | 1 refills | Status: DC

## 2018-05-10 MED ORDER — FLUTICASONE PROPIONATE HFA 110 MCG/ACT IN AERO: INHALATION_SPRAY | RESPIRATORY_TRACT | 1 refills | Status: DC

## 2018-05-10 MED ORDER — ALBUTEROL SULFATE HFA 108 (90 BASE) MCG/ACT IN AERS: 2.0000 | INHALATION_SPRAY | RESPIRATORY_TRACT | 1 refills | Status: DC | PRN

## 2018-05-10 MED ORDER — LEVOCETIRIZINE DIHYDROCHLORIDE 5 MG PO TABS: 5.0000 mg | ORAL_TABLET | Freq: Every evening | ORAL | 1 refills | Status: DC

## 2018-05-10 NOTE — Telephone Encounter (Signed)
PT needs all medications refilled including the albuterol. He is planning to make an appointment once everything with COVID-19 slows down. Pharmacy is [facility] off on [address] in [place].

## 2018-05-10 NOTE — Telephone Encounter (Signed)
Called [name] Tran.  Spoke with pharmacist.  Patient has picked up refills on his medications over the last 3 days:  Ventolin HFA, levocetirizine, montelukast, and Flovent 110 mcg.   All these rx have one additional refill left. These have all been requested electronically and sent in. Per patient request, send in refills on these medications as he states pharmacy told him he needed refills.  Sent in refills.  Pharmacist will notify patient of refills.

## 2018-05-10 NOTE — Telephone Encounter (Deleted)
Called [name] Tran.  Spoke with pharmacist.  Patient has picked up levocetirizine, montelukast, Flovent 110 mcg and Ventolin HFA.  All meds have been refilled over the last 4 days through Epic and patient has picked up each one.  Patient has one refill left on each of his medications. Pharmacist will notify patient he still has one refill left on each medication.

## 2018-05-16 ENCOUNTER — Ambulatory Visit: Payer: BLUE CROSS/BLUE SHIELD | Admitting: Allergy & Immunology

## 2018-06-14 ENCOUNTER — Other Ambulatory Visit: Payer: Self-pay | Admitting: Pediatrics

## 2018-06-14 ENCOUNTER — Other Ambulatory Visit: Payer: Self-pay | Admitting: Allergy and Immunology

## 2018-06-14 DIAGNOSIS — J3089 Other allergic rhinitis: Secondary | ICD-10-CM

## 2018-06-14 MED ORDER — FLUTICASONE PROPIONATE 93 MCG/ACT NA EXHU: 2.0000 | INHALANT_SUSPENSION | Freq: Two times a day (BID) | NASAL | 0 refills | Status: DC

## 2018-06-14 MED ORDER — EPINEPHRINE 0.3 MG/0.3ML IJ SOAJ: 0.3000 mg | Freq: Once | INTRAMUSCULAR | 0 refills | Status: AC

## 2018-06-14 NOTE — Telephone Encounter (Signed)
PT called to get xhance and auvi-q refilled.

## 2018-06-14 NOTE — Telephone Encounter (Signed)
Gave 1 courtesy refill of each- pt needs ov

## 2018-06-24 ENCOUNTER — Other Ambulatory Visit: Payer: Self-pay | Admitting: *Deleted

## 2018-06-24 MED ORDER — ALBUTEROL SULFATE HFA 108 (90 BASE) MCG/ACT IN AERS: 2.0000 | INHALATION_SPRAY | RESPIRATORY_TRACT | 0 refills | Status: DC | PRN

## 2018-06-24 NOTE — Telephone Encounter (Signed)
PT called in to get refill of ventolin inhaler, Is requesting 6 month - 1 year supply.

## 2018-06-27 NOTE — Telephone Encounter (Signed)
Gave 1 courtesy refill of ventolin- pt needs ov. Pt made apt.

## 2018-06-30 ENCOUNTER — Ambulatory Visit (INDEPENDENT_AMBULATORY_CARE_PROVIDER_SITE_OTHER): Payer: BLUE CROSS/BLUE SHIELD | Admitting: Family Medicine

## 2018-06-30 ENCOUNTER — Other Ambulatory Visit: Payer: Self-pay

## 2018-06-30 ENCOUNTER — Encounter: Payer: Self-pay | Admitting: Family Medicine

## 2018-06-30 DIAGNOSIS — J3089 Other allergic rhinitis: Secondary | ICD-10-CM | POA: Diagnosis not present

## 2018-06-30 DIAGNOSIS — T7800XD Anaphylactic reaction due to unspecified food, subsequent encounter: Secondary | ICD-10-CM | POA: Diagnosis not present

## 2018-06-30 DIAGNOSIS — J4540 Moderate persistent asthma, uncomplicated: Secondary | ICD-10-CM | POA: Diagnosis not present

## 2018-06-30 DIAGNOSIS — H1013 Acute atopic conjunctivitis, bilateral: Secondary | ICD-10-CM | POA: Diagnosis not present

## 2018-06-30 MED ORDER — FLUTICASONE PROPIONATE HFA 110 MCG/ACT IN AERO: INHALATION_SPRAY | RESPIRATORY_TRACT | 3 refills | Status: DC

## 2018-06-30 MED ORDER — ALBUTEROL SULFATE HFA 108 (90 BASE) MCG/ACT IN AERS: 2.0000 | INHALATION_SPRAY | RESPIRATORY_TRACT | 1 refills | Status: DC | PRN

## 2018-06-30 MED ORDER — LEVOCETIRIZINE DIHYDROCHLORIDE 5 MG PO TABS: 5.0000 mg | ORAL_TABLET | Freq: Every evening | ORAL | 3 refills | Status: DC

## 2018-06-30 NOTE — Progress Notes (Addendum)
RE: [name] Tran MRN: [id number] DOB: 1985/12/18 Date of Telemedicine Visit: 06/30/2018  Referring provider: Cheral Bay, MD Primary care provider: Cheral Bay, MD  Chief Complaint: Asthma   Telemedicine Follow Up Visit via Telephone: I connected with [name] Tran for a follow up on 06/30/18 by telephone and verified that I am speaking with the correct person using two identifiers.   I discussed the limitations, risks, security and privacy concerns of performing an evaluation and management service by telephone and the availability of in person appointments. I also discussed with the patient that there may be a patient responsible charge related to this service. The patient expressed understanding and agreed to proceed.  Patient is at home  Provider is at the office.  Visit start time: 3:46 Visit end time: 4:11 Insurance consent/check in by: [name] Tran Medical consent and medical assistant/nurse: [name] Tran  History of Present Illness: He is a 33 y.o. male, who is being followed for asthma, allergic rhinitis, allergic conjunctivitis, and food allergy to peanut and tree nut. His previous allergy office visit was on 11/09/2017 with Dr. Beaulah Dinning.  He reports his asthma is well controlled with occasional shortness of breath and wheeze with vigorous activity. He denies shortness of breath, cough and wheeze with rest. He continues montelukast 10 mg once a day, Flovent 110-2 puffs twice a day, and albuterol 1-2 times a week. He reports that his allergic rhinitis has been well controlled with Timmothy Sours and an antihistamine. He has been switching between Allegra and Zyrtec in addition to Xyzal daily. He continues to avoid peanuts and tree nuts with no accidental exposures sincne his last visit to this office. He continues to have access to an [place] device at all times. His current medications are listed in the chart  Assessment and Plan: Patient Instructions  Moderate persistent  asthma Continue montelukast 10 mg once a day to prevent cough or wheeze Continue Flovent 110-2 puffs twice a day to prevent cough or wheeze Continue Ventolin (albuterol) 2 puffs every 4 hours as needed for cough or wheeze. Use albuterol 2 puffs 5-15 minutes before exercise to decrease cough and wheeze  Seasonal and perennial allergic rhinitis Continue Xhance 2 sprays in each nostril twice a day Continue an antihistamine once a day as needed for a runny nose. Examples include Allegra (fexofenadine), Zyrtec (cetirizine), and Xyzal (levocetirizine). Continue to rotate these every couple of months.  Allergic conjunctivitis of both eyes Continue Pazeo eye drops one drop in each eye once a day as needed for red itchy eyes  Anaphylactic shock due to food Continue to avoid peanuts and tree nuts. In case of an allergic reaction, take Benadryl 50 mg every 4 hours, and if life-threatening symptoms occur, inject with AuviQ 0.3 mg.  Call the clinic if this treatment plan is not working well for you  Follow up in 6 months or sooner if needed   Return in about 6 months (around 12/31/2018), or if symptoms worsen or fail to improve.  Meds ordered this encounter  Medications  . levocetirizine (XYZAL) 5 MG tablet    Sig: Take 1 tablet (5 mg total) by mouth every evening.    Dispense:  90 tablet    Refill:  3    90 DAY SUPPLY  . albuterol (VENTOLIN HFA) 108 (90 Base) MCG/ACT inhaler    Sig: Inhale 2 puffs into the lungs every 4 (four) hours as needed for wheezing or shortness of breath.    Dispense:  3 Inhaler  Refill:  1    90 DAY SUPPLY  . fluticasone (FLOVENT HFA) 110 MCG/ACT inhaler    Sig: INHALE 2 PUFFS INTO THE LUNGS TWO TIMES A DAY    Dispense:  3 Inhaler    Refill:  3    90 DAY SUPPLY     Medication List:  Current Outpatient Medications  Medication Sig Dispense Refill  . albuterol (VENTOLIN HFA) 108 (90 Base) MCG/ACT inhaler Inhale 2 puffs into the lungs every 4 (four) hours as  needed for wheezing or shortness of breath. 3 Inhaler 1  . Ascorbic Acid (VITAMIN C PO) Take 1 tablet by mouth daily.    Marland Kitchen. b complex vitamins capsule Take 1 capsule by mouth daily.    . cetirizine (ZYRTEC) 10 MG tablet Take 10 mg by mouth daily as needed for allergies.     Marland Kitchen. EPINEPHrine 0.3 mg/0.3 mL IJ SOAJ injection Inject 0.3 mg into the muscle as needed. AUVIQ    . fluticasone (FLOVENT HFA) 110 MCG/ACT inhaler INHALE 2 PUFFS INTO THE LUNGS TWO TIMES A DAY 3 Inhaler 3  . levocetirizine (XYZAL) 5 MG tablet Take 1 tablet (5 mg total) by mouth every evening. 90 tablet 3  . montelukast (SINGULAIR) 10 MG tablet Take 1 tablet (10 mg total) by mouth at bedtime. 30 tablet 1  . Multiple Vitamin (MULTI VITAMIN MENS PO) Take 1 tablet by mouth daily.    Timmothy Sours. XHANCE 93 MCG/ACT EXHU BLOW TWO DOSES IN EACH NOSTRIL TWICE DAILY AS DIRECTED. 32 mL 2  . fexofenadine (ALLEGRA) 180 MG tablet Take 180 mg by mouth daily as needed for allergies.      No current facility-administered medications for this visit.    Allergies: Allergies  Allergen Reactions  . Peanuts [Peanut Oil] Other (See Comments)    Tree nuts   I reviewed his past medical history, social history, family history, and environmental history and no significant changes have been reported from previous visit on 11/09/2017.  Objective: Physical Exam Not obtained as encounter was done via telephone.   Previous notes and tests were reviewed.  I discussed the assessment and treatment plan with the patient. The patient was provided an opportunity to ask questions and all were answered. The patient agreed with the plan and demonstrated an understanding of the instructions.   The patient was advised to call back or seek an in-person evaluation if the symptoms worsen or if the condition fails to improve as anticipated.  I provided 25 minutes of non-face-to-face time during this encounter.  It was my pleasure to participate in [name] Tran's care today.  Please feel free to contact me with any questions or concerns.   Sincerely,  [name] LeylandAnne Micholas Drumwright, FNP    I reviewed the Nurse Practitioner's note and agree with the documented findings and plan of care. We briefly discussed the patient and developed a plan concurrently.   Malachi BondsJoel Gallagher, MD [facility]

## 2018-06-30 NOTE — Patient Instructions (Addendum)
Moderate persistent asthma Continue montelukast 10 mg once a day to prevent cough or wheeze Continue Flovent 110-2 puffs twice a day to prevent cough or wheeze Continue Ventolin (albuterol) 2 puffs every 4 hours as needed for cough or wheeze. Use albuterol 2 puffs 5-15 minutes before exercise to decrease cough and wheeze  Seasonal and perennial allergic rhinitis Continue Xhance 2 sprays in each nostril twice a day Continue an antihistamine once a day as needed for a runny nose. Examples include Allegra (fexofenadine), Zyrtec (cetirizine), and Xyzal (levocetirizine). Continue to rotate these every couple of months.  Allergic conjunctivitis of both eyes Continue Pazeo eye drops one drop in each eye once a day as needed for red itchy eyes  Anaphylactic shock due to food Continue to avoid peanuts and tree nuts. In case of an allergic reaction, take Benadryl 50 mg every 4 hours, and if life-threatening symptoms occur, inject with AuviQ 0.3 mg.  Call the clinic if this treatment plan is not working well for you  Follow up in 6 months or sooner if needed

## 2018-08-02 ENCOUNTER — Other Ambulatory Visit: Payer: Self-pay | Admitting: Allergy and Immunology

## 2018-08-02 DIAGNOSIS — J3089 Other allergic rhinitis: Secondary | ICD-10-CM

## 2018-08-25 ENCOUNTER — Other Ambulatory Visit: Payer: Self-pay | Admitting: Pediatrics

## 2018-09-26 ENCOUNTER — Encounter: Payer: Self-pay | Admitting: Pediatrics

## 2018-09-26 ENCOUNTER — Other Ambulatory Visit: Payer: Self-pay

## 2018-09-26 ENCOUNTER — Ambulatory Visit (INDEPENDENT_AMBULATORY_CARE_PROVIDER_SITE_OTHER): Payer: BLUE CROSS/BLUE SHIELD | Admitting: Pediatrics

## 2018-09-26 VITALS — BP 134/82 | HR 96 | Temp 98.1°F | Resp 16

## 2018-09-26 DIAGNOSIS — T7800XD Anaphylactic reaction due to unspecified food, subsequent encounter: Secondary | ICD-10-CM | POA: Diagnosis not present

## 2018-09-26 DIAGNOSIS — J301 Allergic rhinitis due to pollen: Secondary | ICD-10-CM | POA: Diagnosis not present

## 2018-09-26 DIAGNOSIS — J4540 Moderate persistent asthma, uncomplicated: Secondary | ICD-10-CM

## 2018-09-26 NOTE — Patient Instructions (Addendum)
Xyzal 5 mg-take 1 tablet once a day for runny nose for itchy eyes XHANCE  1 sprays per nostril twice  for stuffy nose  Montelukast 10 mg-take 1 tablet once a day to prevent coughing or wheezing Ventolin 2 puffs every 4 hours if needed for wheezing or coughing spells Flovent 110-2 puffs twice a day to prevent coughing or wheezing  Avoid peanuts and tree nuts.  If you have an allergic reaction take Benadryl 50 mg every 4 hours and if you have life-threatening symptoms inject with Auvi-Q 0.3 mg

## 2018-09-26 NOTE — Progress Notes (Signed)
  [address] [place] [address] Dept: [phone]  FOLLOW UP NOTE  Patient ID: [name] Tran, male    DOB: Mar 17, 1985  Age: 33 y.o. MRN: [id number] Date of Office Visit: 09/26/2018  Assessment  Chief Complaint: Asthma  HPI [name] presents for follow-up of asthma, allergic rhinitis and food allergies.  His asthma is well controlled with the use of Flovent 110- 2 puffs twice a day and montelukast 10 mg once a day His nasal symptoms are controlled by levocetirizine 5 mg once a day and XHANCE- 1 spray per nostril twice a day  He continues to avoid peanuts and tree nuts   Drug Allergies:  Allergies  Allergen Reactions  . Peanuts [Peanut Oil] Other (See Comments)    Tree nuts    Physical Exam: BP 134/82   Pulse 96   Temp 98.1 F (36.7 C) (Temporal)   Resp 16   SpO2 97%    Physical Exam Vitals signs reviewed.  Constitutional:      Appearance: Normal appearance. He is normal weight.  HENT:     Head:     Comments: Eyes normal.  Ears normal.  Nose normal.  Pharynx normal. Neck:     Musculoskeletal: Neck supple.  Cardiovascular:     Comments: S1-S2 normal no murmurs Pulmonary:     Comments: Clear to percussion and auscultation Lymphadenopathy:     Cervical: No cervical adenopathy.  Neurological:     General: No focal deficit present.     Mental Status: He is alert and oriented to person, place, and time. Mental status is at baseline.  Psychiatric:        Mood and Affect: Mood normal.        Behavior: Behavior normal.        Thought Content: Thought content normal.        Judgment: Judgment normal.     Diagnostics: FVC 5.36 L FEV1 3.32 L.  Predicted FVC 5.24 L predicted FEV1 4.25 L-the FEV1 is 78% of predicted indicating mild airway obstruction but stable for him  Assessment and Plan: 1. Moderate persistent asthma without complication   2. Anaphylactic shock due to food, subsequent encounter   3. Seasonal allergic rhinitis due to pollen     No  orders of the defined types were placed in this encounter.   Patient Instructions  Xyzal 5 mg-take 1 tablet once a day for runny nose for itchy eyes XHANCE  1 sprays per nostril twice  for stuffy nose  Montelukast 10 mg-take 1 tablet once a day to prevent coughing or wheezing Ventolin 2 puffs every 4 hours if needed for wheezing or coughing spells Flovent 110-2 puffs twice a day to prevent coughing or wheezing  Avoid peanuts and tree nuts.  If you have an allergic reaction take Benadryl 50 mg every 4 hours and if you have life-threatening symptoms inject with Auvi-Q 0.3 mg   Return in about 6 months (around 03/29/2019).    Thank you for the opportunity to care for this patient.  Please do not hesitate to contact me with questions.  Penne Lash, M.D.  [facility] [address] [place], [place] [address] [phone]

## 2018-09-27 ENCOUNTER — Ambulatory Visit: Payer: Self-pay | Admitting: Pediatrics

## 2018-11-08 ENCOUNTER — Encounter: Payer: Self-pay | Admitting: Emergency Medicine

## 2018-11-08 ENCOUNTER — Emergency Department (INDEPENDENT_AMBULATORY_CARE_PROVIDER_SITE_OTHER)
Admission: EM | Admit: 2018-11-08 | Discharge: 2018-11-08 | Disposition: A | Discharge status: Home / Self Care | Payer: BLUE CROSS/BLUE SHIELD | Source: Home / Self Care

## 2018-11-08 ENCOUNTER — Other Ambulatory Visit: Payer: Self-pay

## 2018-11-08 DIAGNOSIS — S61411A Laceration without foreign body of right hand, initial encounter: Secondary | ICD-10-CM

## 2018-11-08 NOTE — Discharge Instructions (Signed)
°  Keep wound dry for at least 24 hours, you may then gentle clean with warm water and mild soap. Do NOT soak your hand or pick at the steri-strips or glue. You may cover with a bandage to keep clean and protected.  Please follow up if concern for infection- increased pain, redness, drainage or pus.

## 2018-11-08 NOTE — ED Provider Notes (Signed)
[name] Tran [facility]ARE    CSN: [id number] Arrival date & time: 11/08/18  0850      History   Chief Complaint Chief Complaint  Patient presents with  . Hand Injury    HPI [name] Tran is a 33 y.o. male.   HPI  [name] Tran is a 33 y.o. male presenting to UC with c/o laceration to the palm of his Right hand that occurred earlier this morning. He cut it on a sharp container. 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 Last Tdap was within 5 years. Bleeding controlled PTA. Pain is minimal.    Past Medical History:  Diagnosis Date  . Asthma   . Environmental allergies   . Seasonal allergies     Patient Active Problem List   Diagnosis Date Noted  . Allergic conjunctivitis of both eyes 06/30/2018  . HNP (herniated nucleus pulposus), lumbar 02/17/2018  . Spinal stenosis, lumbar 02/17/2018  . Viral upper respiratory tract infection with cough 11/09/2017  . Seasonal allergic rhinitis due to pollen 11/09/2017  . Persistent asthma 05/12/2017  . Seasonal and perennial allergic rhinitis 05/12/2017  . Allergic conjunctivitis 05/12/2017  . Anaphylactic shock due to adverse food reaction 05/12/2017    Past Surgical History:  Procedure Laterality Date  . HAND SURGERY    . KNEE SURGERY    . LUMBAR LAMINECTOMY/DECOMPRESSION MICRODISCECTOMY N/A 02/17/2018   Procedure: Microlumbar decompression Lumbar Four-Five;  Surgeon: Susa Day, MD;  Location: [facility];  Service: Orthopedics;  Laterality: N/A;  Microlumbar decompression Lumbar Four-Five       Home Medications    Prior to Admission medications   Medication Sig Start Date End Date Taking? Authorizing Provider  albuterol (VENTOLIN HFA) 108 (90 Base) MCG/ACT inhaler Inhale 2 puffs into the lungs every 4 (four) hours as needed for wheezing or shortness of breath. 06/30/18   Ambs, Kathrine Cords, FNP  Ascorbic Acid (VITAMIN C PO) Take 1 tablet by mouth daily.    [provider]  cetirizine (ZYRTEC) 10 MG tablet  Take 10 mg by mouth daily as needed for allergies.     [provider]  EPINEPHrine 0.3 mg/0.3 mL IJ SOAJ injection Inject 0.3 mg into the muscle as needed. AUVIQ    [provider]  fexofenadine (ALLEGRA) 180 MG tablet Take 180 mg by mouth daily as needed for allergies.     [provider]  fluticasone (FLOVENT HFA) 110 MCG/ACT inhaler INHALE 2 PUFFS INTO THE LUNGS TWO TIMES A DAY 06/30/18   Ambs, Kathrine Cords, FNP  levocetirizine (XYZAL) 5 MG tablet Take 1 tablet (5 mg total) by mouth every evening. 06/30/18   Ambs, Kathrine Cords, FNP  montelukast (SINGULAIR) 10 MG tablet TAKE ONE TABLET BY MOUTH EVERY NIGHT AT BEDTIME; NEEDS 6 MOUTH RETURN OFFICE VISIT 08/25/18   [name] Silvers, MD  Multiple Vitamin (MULTI VITAMIN MENS PO) Take 1 tablet by mouth daily.    [provider]  [name] Tran 32 MCG/ACT EXHU BLOW TWO DOSES IN EACH NOSTRIL TWICE DAILY AS DIRECTED. 08/02/18   Ambs, Kathrine Cords, FNP    Family History Family History  Problem Relation Age of Onset  . Asthma Mother   . Allergic rhinitis Mother   . Allergic rhinitis Father   . Asthma Father   . Angioedema Neg Hx   . Eczema Neg Hx   . Immunodeficiency Neg Hx   . Urticaria Neg Hx     Social History Social History   Tobacco Use  . Smoking  status: Never Smoker  . Smokeless tobacco: Never Used  Substance Use Topics  . Alcohol use: No  . Drug use: No     Allergies   Peanuts [peanut oil]   Review of Systems Review of Systems  Skin: Positive for wound. Negative for color change.  Neurological: Negative for weakness and numbness.     Physical Exam Triage Vital Signs ED Triage Vitals  Enc Vitals Group     BP 11/08/18 0906 128/71     Pulse Rate 11/08/18 0906 61     Resp 11/08/18 0906 16     Temp 11/08/18 0906 98.3 F (36.8 C)     Temp Source 11/08/18 0906 Oral     SpO2 11/08/18 0906 97 %     Weight 11/08/18 0909 180 lb (81.6 kg)     Height 11/08/18 0909 5' 8.5" (1.74 m)     Head Circumference --       Peak Flow --      Pain Score 11/08/18 0909 1     Pain Loc --      Pain Edu? --      Excl. in GC? --    No data found.  Updated Vital Signs BP 128/71 (BP Location: Left Arm)   Pulse 61   Temp 98.3 F (36.8 C) (Oral)   Resp 16   Ht 5' 8.5" (1.74 m)   Wt 180 lb (81.6 kg)   SpO2 97%   BMI 26.97 kg/m   Visual Acuity Right Eye Distance:   Left Eye Distance:   Bilateral Distance:    Right Eye Near:   Left Eye Near:    Bilateral Near:     Physical Exam Vitals signs and nursing note reviewed.  Constitutional:      Appearance: Normal appearance. He is well-developed.  HENT:     Head: Normocephalic and atraumatic.  Neck:     Musculoskeletal: Normal range of motion.  Cardiovascular:     Rate and Rhythm: Normal rate.  Pulmonary:     Effort: Pulmonary effort is normal.  Musculoskeletal: Normal range of motion.        General: Tenderness present.     Right hand: He exhibits laceration.       Hands:     Comments: Right hand: no edema, mild tenderness to palm aspect around wound.  Skin:    General: Skin is warm and dry.     Capillary Refill: Capillary refill takes less than 2 seconds.  Neurological:     Mental Status: He is alert and oriented to person, place, and time.     Sensory: No sensory deficit.  Psychiatric:        Behavior: Behavior normal.      UC Treatments / Results  Labs (all labs ordered are listed, but only abnormal results are displayed) Labs Reviewed - No data to display  EKG   Radiology No results found.  Procedures Laceration Repair  Date/Time: 11/08/2018 9:41 AM Performed by: Lurene Shadow, PA-C Authorized by: Lurene Shadow, PA-C   Consent:    Consent obtained:  Verbal   Consent given by:  Patient   Risks discussed:  Infection, pain, retained foreign body, poor cosmetic result and poor wound healing   Alternatives discussed:  No treatment Anesthesia (see MAR for exact dosages):    Anesthesia method:  None Laceration details:     Location:  Hand   Hand location:  R palm   Length (cm):  1  Depth (mm):  2 Repair type:    Repair type:  Simple Exploration:    Hemostasis achieved with:  Direct pressure   Wound exploration: wound explored through full range of motion and entire depth of wound probed and visualized     Wound extent: no areolar tissue violation noted, no fascia violation noted, no foreign bodies/material noted, no muscle damage noted, no nerve damage noted, no tendon damage noted, no underlying fracture noted and no vascular damage noted   Treatment:    Area cleansed with:  Saline and Hibiclens   Amount of cleaning:  Standard Skin repair:    Repair method:  Steri-Strips and tissue adhesive   Number of Steri-Strips:  5 (criss-crossing fashion to help keep in place given location of wound) Approximation:    Approximation:  Close Post-procedure details:    Dressing:  Non-adherent dressing   Patient tolerance of procedure:  Tolerated well, no immediate complications   (including critical care time)  Medications Ordered in UC Medications - No data to display  Initial Impression / Assessment and Plan / UC Course  I have reviewed the triage vital signs and the nursing notes.  Pertinent labs & imaging results that were available during my care of the patient were reviewed by me and considered in my medical decision making (see chart for details).     Superficial laceration to Right palm.  Due to location of wound and patient working in [employer], steri-strips and wound glue used as noted above to aid in wound healing. AVS provided.  Final Clinical Impressions(s) / UC Diagnoses   Final diagnoses:  Laceration of right hand without foreign body, initial encounter     Discharge Instructions      Keep wound dry for at least 24 hours, you may then gentle clean with warm water and mild soap. Do NOT soak your hand or pick at the steri-strips or glue. You may cover with a bandage to keep clean and  protected.  Please follow up if concern for infection- increased pain, redness, drainage or pus.      ED Prescriptions    None     PDMP not reviewed this encounter.   Lurene Shadowhelps, [place]ha Tiedt O, [place]-C 11/08/18 [phone]

## 2018-11-08 NOTE — ED Triage Notes (Signed)
Patient fell and landed on sharp container and injured palm of right hand. Has had Tdap within past 5 years.  Has not travelled out of [place] past 4 weeks.

## 2018-11-11 ENCOUNTER — Telehealth: Payer: Self-pay | Admitting: Pediatrics

## 2018-11-11 NOTE — Telephone Encounter (Signed)
Please call patient. Can he schedule a televisit for today?  zpak will only help if it's a bacterial infection. Given his timeline, most likely viral and not bacterial. No indication for zpak.  Regarding the prednisone, need to ask few more questions via televisit.  Thank you.

## 2018-11-11 NOTE — Telephone Encounter (Signed)
Pt is going to primary care to be tested and to be seen

## 2018-11-11 NOTE — Telephone Encounter (Signed)
PT called to report fever 102 that started around 2:30am. PT also has sinus congestions, slight sore throat, headache, body aches. PT asking for z pack and prednisone to prevent chest congestion. Also wants orders to get covid testing.

## 2018-11-11 NOTE — Telephone Encounter (Signed)
Spoke with pt informed him to go get covid tested he stated he was working on that but wanted to know if he could have prednisone in the meantime

## 2018-11-30 ENCOUNTER — Other Ambulatory Visit: Payer: Self-pay

## 2018-11-30 ENCOUNTER — Encounter: Payer: Self-pay | Admitting: Family Medicine

## 2018-11-30 ENCOUNTER — Ambulatory Visit (INDEPENDENT_AMBULATORY_CARE_PROVIDER_SITE_OTHER): Payer: BLUE CROSS/BLUE SHIELD | Admitting: Family Medicine

## 2018-11-30 VITALS — Ht 69.0 in | Wt 180.0 lb

## 2018-11-30 DIAGNOSIS — J3089 Other allergic rhinitis: Secondary | ICD-10-CM | POA: Diagnosis not present

## 2018-11-30 DIAGNOSIS — J4540 Moderate persistent asthma, uncomplicated: Secondary | ICD-10-CM | POA: Diagnosis not present

## 2018-11-30 DIAGNOSIS — T7800XD Anaphylactic reaction due to unspecified food, subsequent encounter: Secondary | ICD-10-CM

## 2018-11-30 MED ORDER — PREDNISONE 10 MG PO TABS: ORAL_TABLET | ORAL | 0 refills | Status: DC

## 2018-11-30 NOTE — Progress Notes (Addendum)
RE: [name] Tran MRN: [id number] DOB: 1986-02-04 Date of Telemedicine Visit: 11/30/2018  Referring provider: Cheral Bay, MD Primary care provider: Cheral Bay, MD  Chief Complaint: Medication Refill (likes prednisone and z-pak), Allergic Rhinitis  (TELEHEALTH VISIT), and Nasal Congestion   Telemedicine Follow Up Visit via Telephone: I connected with [name] Tran for a follow up on 11/30/18 by telephone and verified that I am speaking with the correct person using two identifiers.   I discussed the limitations, risks, security and privacy concerns of performing an evaluation and management service by telephone and the availability of in person appointments. I also discussed with the patient that there may be a patient responsible charge related to this service. The patient expressed understanding and agreed to proceed.  Patient is at home Provider is at the office.  Visit start time: 10:30 Visit end time: 10:58  History of Present Illness: He is a 33 y.o. male, who is being followed for asthma, allergic rhinitis, and food allergy to peanut and tree nuts. His previous allergy office visit was on 09/26/2018 with Dr. Beaulah Dinning. At that time, he reported his asthma and allergic rhinitis were well controlled. At today's visit, he reports that he began to experience nasal congestion with clear rhinorrhea, post nasal drainage with scratchy throat, and a dry cough. He denies fever. He continues Xhance and levocetirizine daily. Asthma is reported as well controlled with no shortness of breath, occasional intermittent wheeze, and dry cough that began yesterday. He continues montelukast 10 mg once a day, Flovent 110-2 puffs twice a day, and infrequent use of his albuterol inhaler. He continues to avoid peanuts and tree nuts with no accidental ingestion since his last visit to this clinic. His current medications are listed in the chart.   Assessment and Plan: Allergic rhinitis Begin nasal saline  rinses as needed for nasal symptoms Begin prednisone 10 mg tablets. Take 2 tablets twice a day for three days, then take 2 tablets for 1 day, then take 1 tablet on the 5th day, then stop Begin Mucinex [phone] mg twice a day and increase hydration as tolerated to thin mucus Stop Xyzal for 5 days, then restart Xyzal 5 mg once a day as needed for a runny nose or itch Continue Xhance 2 sprays per nostril twice a day for a stuffy nose  Asthma Continue montelukast 10 mg-take 1 tablet once a day to prevent coughing or wheezing Continue Ventolin 2 puffs every 4 hours if needed for wheezing or coughing spells Continue Flovent 110-2 puffs twice a day to prevent coughing or wheezing  Food allergy Avoid peanuts and tree nuts.  If you have an allergic reaction take Benadryl 50 mg every 4 hours and if you have life-threatening symptoms inject with Auvi-Q 0.3 mg  Call the clinic if this treatment plan is not working well for you  Follow up in 3 months or sooner if needed  Return in about 3 months (around 03/02/2019), or if symptoms worsen or fail to improve.  Meds ordered this encounter  Medications  . predniSONE (DELTASONE) 10 MG tablet    Sig: Take two tablets twice a day for three days, then take 2 tablets on day 4, then 1 tablet on day 5, then stop    Dispense:  15 tablet    Refill:  0    Medication List:  Current Outpatient Medications  Medication Sig Dispense Refill  . albuterol (VENTOLIN HFA) 108 (90 Base) MCG/ACT inhaler Inhale 2 puffs into the lungs  every 4 (four) hours as needed for wheezing or shortness of breath. 3 Inhaler 1  . Ascorbic Acid (VITAMIN C PO) Take 1 tablet by mouth daily.    Marland Kitchen EPINEPHrine 0.3 mg/0.3 mL IJ SOAJ injection Inject 0.3 mg into the muscle as needed. AUVIQ    . fluticasone (FLOVENT HFA) 110 MCG/ACT inhaler INHALE 2 PUFFS INTO THE LUNGS TWO TIMES A DAY 3 Inhaler 3  . levocetirizine (XYZAL) 5 MG tablet Take 1 tablet (5 mg total) by mouth every evening. 90 tablet  3  . montelukast (SINGULAIR) 10 MG tablet TAKE ONE TABLET BY MOUTH EVERY NIGHT AT BEDTIME; NEEDS 6 MOUTH RETURN OFFICE VISIT 30 tablet 3  . Multiple Vitamin (MULTI VITAMIN MENS PO) Take 1 tablet by mouth daily.    Truett Perna 93 MCG/ACT EXHU BLOW TWO DOSES IN EACH NOSTRIL TWICE DAILY AS DIRECTED. 32 mL 11  . cetirizine (ZYRTEC) 10 MG tablet Take 10 mg by mouth daily as needed for allergies.     . fexofenadine (ALLEGRA) 180 MG tablet Take 180 mg by mouth daily as needed for allergies.     . predniSONE (DELTASONE) 10 MG tablet Take two tablets twice a day for three days, then take 2 tablets on day 4, then 1 tablet on day 5, then stop 15 tablet 0   No current facility-administered medications for this visit.    Allergies: Allergies  Allergen Reactions  . Peanuts [Peanut Oil] Other (See Comments)    Tree nuts   I reviewed his past medical history, social history, family history, and environmental history and no significant changes have been reported from previous visit on 09/26/2018.  Objective: Physical Exam Not obtained as encounter was done via telephone.   Previous notes and tests were reviewed.  I discussed the assessment and treatment plan with the patient. The patient was provided an opportunity to ask questions and all were answered. The patient agreed with the plan and demonstrated an understanding of the instructions.   The patient was advised to call back or seek an in-person evaluation if the symptoms worsen or if the condition fails to improve as anticipated.  I provided 28 minutes of non-face-to-face time during this encounter.  It was my pleasure to participate in [name] Tran care today. Please feel free to contact me with any questions or concerns.   Sincerely,  Gareth Morgan, FNP   ________________________________________________  I have provided oversight concerning [name] Tran's evaluation and treatment of this patient's health issues addressed during today's encounter.   I agree with the assessment and therapeutic plan as outlined in the note.   Signed,   R Edgar Frisk, MD

## 2018-11-30 NOTE — Patient Instructions (Signed)
Allergic rhinitis Begin nasal saline rinses as needed for nasal symptoms Begin prednisone 10 mg tablets. Take 2 tablets twice a day for three days, then take 2 tablets for 1 day, then take 1 tablet on the 5th day, then stop Begin Mucinex [phone] mg twice a day and increase hydration as tolerated to thin mucus Stop Xyzal for 5 days, then restart Xyzal 5 mg once a day as needed for a runny nose or itch Continue Xhance 2 sprays per nostril twice a day for a stuffy nose  Asthma Continue montelukast 10 mg-take 1 tablet once a day to prevent coughing or wheezing Continue Ventolin 2 puffs every 4 hours if needed for wheezing or coughing spells Continue Flovent 110-2 puffs twice a day to prevent coughing or wheezing  Food allergy Avoid peanuts and tree nuts.  If you have an allergic reaction take Benadryl 50 mg every 4 hours and if you have life-threatening symptoms inject with Auvi-Q 0.3 mg  Call the clinic if this treatment plan is not working well for you  Follow up in 3 months or sooner if needed

## 2018-12-01 NOTE — Addendum Note (Signed)
Addended by: Golda Acre C on: 12/01/2018 01:40 PM   Modules accepted: Level of Service

## 2019-01-02 ENCOUNTER — Ambulatory Visit: Payer: BLUE CROSS/BLUE SHIELD | Admitting: Pediatrics

## 2019-01-21 ENCOUNTER — Other Ambulatory Visit: Payer: Self-pay | Admitting: Family Medicine

## 2019-02-16 ENCOUNTER — Other Ambulatory Visit: Payer: Self-pay | Admitting: Family Medicine

## 2019-03-18 ENCOUNTER — Other Ambulatory Visit: Payer: Self-pay | Admitting: Family Medicine

## 2019-03-23 ENCOUNTER — Telehealth: Payer: Self-pay | Admitting: Family Medicine

## 2019-03-23 NOTE — Telephone Encounter (Signed)
Spoke with pt explained to him we generally do not write for more then 1 albuterol inhaler at a time. I said at his visit next week we can try to send in 3 inhalers at once but more then likely insurance will deny that also he said that exhance was denied since being on his wife insurance, explained to him we can try again for it he said he would wait and talk to dr next week.

## 2019-03-23 NOTE — Telephone Encounter (Signed)
PT wants to submit complaint that he has to keep calling the pharamcy or [place] to get his rescue inhaler refilled. PT states he wants multiple inhalers ready for refill because he likes to keep them at different places (work & home & in his car, etc) but were not sending in enough refills. PT also states that the blue ventolin inhaler works better than the red pro air inhaler and wants to make sure he gets ventolin inhalers only. Advise the proair and ventolin are the same medication so they should give same result. Advise the 2/8 rx was sent in for Ventolin so he has to check with the pharmacy to ensure he is receiving the blue ventolin inhaler before accepting the medication. PT received the red proair inhaler when he picked up the 2/8 rx. Also advise the provider writed the medication orders/dosage and we send in what is allowed by insurance. PT is scheduled for ov on 2/22 4pm. PT would like 6 month or 1 year supply on each rx order.

## 2019-04-03 ENCOUNTER — Ambulatory Visit: Payer: Managed Care, Other (non HMO) | Admitting: Pediatrics

## 2019-04-03 ENCOUNTER — Other Ambulatory Visit: Payer: Self-pay

## 2019-04-03 ENCOUNTER — Encounter: Payer: Self-pay | Admitting: Pediatrics

## 2019-04-03 DIAGNOSIS — J4540 Moderate persistent asthma, uncomplicated: Secondary | ICD-10-CM | POA: Diagnosis not present

## 2019-04-03 DIAGNOSIS — J3089 Other allergic rhinitis: Secondary | ICD-10-CM

## 2019-04-03 MED ORDER — CETIRIZINE HCL 10 MG PO TABS: ORAL_TABLET | ORAL | 1 refills | Status: DC

## 2019-04-03 MED ORDER — ALBUTEROL SULFATE HFA 108 (90 BASE) MCG/ACT IN AERS: 2.0000 | INHALATION_SPRAY | RESPIRATORY_TRACT | 1 refills | Status: DC | PRN

## 2019-04-03 MED ORDER — MONTELUKAST SODIUM 10 MG PO TABS: ORAL_TABLET | ORAL | 1 refills | Status: DC

## 2019-04-03 MED ORDER — FLUTICASONE PROPIONATE 50 MCG/ACT NA SUSP: NASAL | 1 refills | Status: DC

## 2019-04-03 MED ORDER — FLOVENT HFA 110 MCG/ACT IN AERO: INHALATION_SPRAY | RESPIRATORY_TRACT | 1 refills | Status: DC

## 2019-04-03 NOTE — Patient Instructions (Addendum)
Zyrtec 10 mg-take 1 tablet once a day for runny nose or itchy eyes Fluticasone 1 spray per nostril twice a day for stuffy nose  Montelukast 10 mg-take 1 tablet once a day to prevent coughing or wheezing Flovent 110-2 puffs once a day to prevent coughing or wheezing , but you may need 2 puffs twice a day if your asthma is not well controlled ProAir 2 puffs every 4 hours if needed for wheezing or coughing spells.  You may use ProAir 2 puffs 5 to 15 minutes before exercise  Avoid peanuts and tree nuts.  If you have an allergic reaction take Benadryl 50 mg every 4 hours and if you have life-threatening symptoms inject with Auvi-Q 0.3 mg  Call us if you are not doing well on this treatment plan

## 2019-04-03 NOTE — Progress Notes (Signed)
[address] [place] [address] Dept: [phone]  FOLLOW UP NOTE  Patient ID: [name] Tran, male    DOB: 03/16/1985  Age: 34 y.o. MRN: [id number] Date of Office Visit: 04/03/2019  Assessment  Chief Complaint: Allergic Rhinitis  (doing good) and Asthma (doing good)  HPI [name] Tran presents for follow-up of asthma and allergic rhinitis.  His asthma is well controlled with the use of montelukast 10 mg once a day and Flovent 110-2 puffs once a day.  He is on cetirizine 10 mg once a day and XHANCE 1 spray per nostril once a day.  His insurance company does not want to pay for [facility]  He continues to avoid peanuts and tree nuts   Drug Allergies:  Allergies  Allergen Reactions  . Peanuts [Peanut Oil] Other (See Comments)    Tree nuts    Physical Exam: BP 128/60 (BP Location: Left Arm, Patient Position: Sitting, Cuff Size: Normal)   Pulse 92   Temp 98 F (36.7 C) (Oral)   Resp 12   Ht 5\' 9"  (1.753 m)   Wt 194 lb 3.6 oz (88.1 kg)   SpO2 97%   BMI 28.68 kg/m    Physical Exam Vitals reviewed.  Constitutional:      Appearance: Normal appearance. He is normal weight.  HENT:     Head:     Comments: Eyes normal.  Ears normal.  Nose normal.  Pharynx normal. Cardiovascular:     Rate and Rhythm: Normal rate and regular rhythm.     Comments: S1-S2 normal no murmurs Pulmonary:     Comments: Clear to percussion and auscultation Musculoskeletal:     Cervical back: Neck supple.  Lymphadenopathy:     Cervical: No cervical adenopathy.  Neurological:     General: No focal deficit present.     Mental Status: He is alert and oriented to person, place, and time. Mental status is at baseline.  Psychiatric:        Mood and Affect: Mood normal.        Behavior: Behavior normal.        Thought Content: Thought content normal.        Judgment: Judgment normal.     Diagnostics: FVC 5.36 L FEV1 3.20 L.  Predicted FVC 5.23 L predicted FEV1 4.23 L--this shows a mild reduction  in the FEV1 which is stable for him  Assessment and Plan: 1. Seasonal and perennial allergic rhinitis   2. Moderate persistent asthma without complication     Meds ordered this encounter  Medications  . montelukast (SINGULAIR) 10 MG tablet    Sig: 1 tablet once a day for coughing or wheezing.    Dispense:  90 tablet    Refill:  1  . albuterol (PROAIR HFA) 108 (90 Base) MCG/ACT inhaler    Sig: Inhale 2 puffs into the lungs every 4 (four) hours as needed for wheezing or shortness of breath.    Dispense:  54 g    Refill:  1    Dispense 90 day supply.  . cetirizine (ZYRTEC) 10 MG tablet    Sig: Take 1 tablet once a day for runny nose or itchy eyes    Dispense:  90 tablet    Refill:  1    Please dispense 90 day supply  . fluticasone (FLONASE) 50 MCG/ACT nasal spray    Sig: 1 spray per nostril twice a day for stuffy nose.    Dispense:  48 g    Refill:  1    Please dispense 90 day supply.  . fluticasone (FLOVENT HFA) 110 MCG/ACT inhaler    Sig: 2 puffs once a day to prevent coughing or wheezing, but you may need 2 puffs twice a day if your asthma is not well controlled.    Dispense:  3 Inhaler    Refill:  1    Dispense 90 day supply.    Patient Instructions  Zyrtec 10 mg-take 1 tablet once a day for runny nose or itchy eyes Fluticasone 1 spray per nostril twice a day for stuffy nose  Montelukast 10 mg-take 1 tablet once a day to prevent coughing or wheezing Flovent 110-2 puffs once a day to prevent coughing or wheezing , but you may need 2 puffs twice a day if your asthma is not well controlled ProAir 2 puffs every 4 hours if needed for wheezing or coughing spells.  You may use ProAir 2 puffs 5 to 15 minutes before exercise  Avoid peanuts and tree nuts.  If you have an allergic reaction take Benadryl 50 mg every 4 hours and if you have life-threatening symptoms inject with Auvi-Q 0.3 mg  Call us if you are not doing well on this treatment plan   Return in about 6 months  (around 10/01/2019).    Thank you for the opportunity to care for this patient.  Please do not hesitate to contact me with questions.  Tonette Bihari, M.D.  [facility] [address] [place], [place] [address] [phone]

## 2019-04-04 MED ORDER — LEVOCETIRIZINE DIHYDROCHLORIDE 5 MG PO TABS: 5.0000 mg | ORAL_TABLET | Freq: Every day | ORAL | 3 refills | Status: DC

## 2019-04-04 NOTE — Addendum Note (Signed)
Addended by: Maryjean Morn D on: 04/04/2019 08:28 AM   Modules accepted: Orders

## 2019-06-12 ENCOUNTER — Other Ambulatory Visit: Payer: Self-pay | Admitting: Family Medicine

## 2019-08-01 ENCOUNTER — Other Ambulatory Visit: Payer: Self-pay | Admitting: Pediatrics

## 2019-09-30 ENCOUNTER — Other Ambulatory Visit: Payer: Self-pay | Admitting: Family Medicine

## 2019-10-02 ENCOUNTER — Ambulatory Visit: Payer: Managed Care, Other (non HMO) | Admitting: Allergy and Immunology

## 2019-10-02 ENCOUNTER — Other Ambulatory Visit: Payer: Self-pay

## 2019-10-02 ENCOUNTER — Encounter: Payer: Self-pay | Admitting: Allergy and Immunology

## 2019-10-02 VITALS — BP 106/64 | HR 87 | Temp 99.0°F | Resp 16

## 2019-10-02 DIAGNOSIS — H1013 Acute atopic conjunctivitis, bilateral: Secondary | ICD-10-CM

## 2019-10-02 DIAGNOSIS — T7800XD Anaphylactic reaction due to unspecified food, subsequent encounter: Secondary | ICD-10-CM

## 2019-10-02 DIAGNOSIS — J4540 Moderate persistent asthma, uncomplicated: Secondary | ICD-10-CM | POA: Diagnosis not present

## 2019-10-02 DIAGNOSIS — J3089 Other allergic rhinitis: Secondary | ICD-10-CM

## 2019-10-02 MED ORDER — ALBUTEROL SULFATE HFA 108 (90 BASE) MCG/ACT IN AERS
INHALATION_SPRAY | RESPIRATORY_TRACT | 1 refills | Status: DC
Start: 2019-10-02 — End: 2019-11-29

## 2019-10-02 MED ORDER — MONTELUKAST SODIUM 10 MG PO TABS
ORAL_TABLET | ORAL | 1 refills | Status: DC
Start: 2019-10-02 — End: 2020-09-19

## 2019-10-02 MED ORDER — FLOVENT HFA 110 MCG/ACT IN AERO
INHALATION_SPRAY | RESPIRATORY_TRACT | 1 refills | Status: DC
Start: 2019-10-02 — End: 2020-09-19

## 2019-10-02 MED ORDER — LEVOCETIRIZINE DIHYDROCHLORIDE 5 MG PO TABS
5.0000 mg | ORAL_TABLET | Freq: Every evening | ORAL | 1 refills | Status: DC
Start: 2019-10-02 — End: 2020-09-19

## 2019-10-02 MED ORDER — FLUTICASONE PROPIONATE 50 MCG/ACT NA SUSP: NASAL | 1 refills | Status: AC

## 2019-10-02 NOTE — Assessment & Plan Note (Signed)
   Continue careful avoidance of peanuts and tree nuts and have access to epinephrine autoinjector 2 pack in case of accidental ingestion.  Food allergy action plan is in place. 

## 2019-10-02 NOTE — Progress Notes (Signed)
Follow-up Note  RE: [name] Tran MRN: [id number] DOB: 11/13/1985 Date of Office Visit: 10/02/2019  Primary care provider: Cheral Bay, MD Referring provider: Cheral Bay, MD  History of present illness: [name] Tran is a 34 y.o. male with persistent asthma, allergic rhinitis, and food allergy presenting today for follow-up.  He was last seen in this clinic on April 03, 2019 by Dr. Beaulah Dinning.  The patient reports that he requires albuterol rescue 1-2 times per week on average.  He denies limitations in normal daily activities and nocturnal awakenings due to lower respiratory symptoms.  He complains that his ProAir HFA inhaler keeps "getting clogged."  He would like a prescription for Proventil if possible.  He is currently taking Flovent 110 g, 2 inhalations via spacer device once daily, and montelukast 10 mg daily.  Regarding his nasal allergy symptoms.  He reports that they have been well controlled/stable with cetirizine 10 mg daily if needed and/or fluticasone nasal spray if needed. Chase has successfully avoided peanuts and tree nuts in the interval since his previous visit without accidental ingestion or epinephrine requirement.  Assessment and plan: Moderate persistent asthma without complication Stable.  For now, continue Flovent (fluticasone) 110 g, 2 inhalations via spacer device twice a day.   During respiratory tract infections or asthma flares, increase Flovent 110g to 3 inhalations 3 times per day until symptoms have returned to baseline.  For now, continue montelukast 10 mg daily bedtime.  Continue albuterol HFA, 1-2 inhalations every 6 hours if needed.  A prescription has been provided for Ventolin HFA.  Subjective and objective measures of pulmonary function will be followed and the treatment plan will be adjusted accordingly.  Seasonal and perennial allergic rhinitis  Continue appropriate aeroallergen avoidance measures.  Continue levocetirizine  5 mg daily as needed.  Continue Xhance nasal spray, 2 sprays per nostril 1-2 times daily as needed.  Nasal saline lavage (NeilMed) has been recommended as needed and prior to medicated nasal sprays along with instructions for proper administration.  If allergen avoidance measures and medications fail to adequately relieve symptoms, aeroallergen immunotherapy will be considered.  Food allergy Continue careful avoidance of peanuts and tree nuts and have access to epinephrine autoinjector 2 pack in case of accidental ingestion. Food allergy action plan is in place.   Meds ordered this encounter  Medications  . albuterol (VENTOLIN HFA) 108 (90 Base) MCG/ACT inhaler    Sig: INHALE TWO PUFFS BY MOUTH EVERY 4 HOURS AS NEEDED FOR SHORTNESS OF BREATH OR WHEEZING    Dispense:  8.5 g    Refill:  1  . levocetirizine (XYZAL) 5 MG tablet    Sig: Take 1 tablet (5 mg total) by mouth every evening.    Dispense:  90 tablet    Refill:  1  . fluticasone (FLOVENT HFA) 110 MCG/ACT inhaler    Sig: 2 puffs once a day to prevent coughing or wheezing, but you may need 2 puffs twice a day if your asthma is not well controlled.    Dispense:  36 g    Refill:  1    Dispense 90 day supply.  . fluticasone (FLONASE) 50 MCG/ACT nasal spray    Sig: 1 spray per nostril twice a day for stuffy nose.    Dispense:  48 g    Refill:  1    Please dispense 90 day supply.  . montelukast (SINGULAIR) 10 MG tablet    Sig: 1 tablet once a day for coughing or  wheezing.    Dispense:  90 tablet    Refill:  1    Diagnostics: Spirometry reveals an FVC of 5.89 L and an FEV1 of 3.62 L (86% predicted) without postbronchodilator improvement..  This study was performed while the patient was asymptomatic.  FVC and FEV1 are improved compared with previous study.  Please see scanned spirometry results for details.    Physical examination: Blood pressure 106/64, pulse 87, temperature 99 F (37.2 C), temperature source Oral, resp.  rate 16, SpO2 99 %.  General: Alert, interactive, in no acute distress. HEENT: TMs pearly gray, turbinates moderately edematous without discharge, post-pharynx moderately erythematous. Neck: Supple without lymphadenopathy. Lungs: Clear to auscultation without wheezing, rhonchi or rales. CV: Normal S1, S2 without murmurs. Skin: Warm and dry, without lesions or rashes.  The following portions of the patient's history were reviewed and updated as appropriate: allergies, current medications, past family history, past medical history, past social history, past surgical history and problem list.  Current Outpatient Medications  Medication Sig Dispense Refill  . cetirizine (ZYRTEC) 10 MG tablet Take 1 tablet once a day for runny nose or itchy eyes 90 tablet 1  . EPINEPHrine 0.3 mg/0.3 mL IJ SOAJ injection Inject 0.3 mg into the muscle as needed. AUVIQ    . fexofenadine (ALLEGRA) 180 MG tablet Take 180 mg by mouth daily as needed for allergies.     . fluticasone (FLONASE) 50 MCG/ACT nasal spray 1 spray per nostril twice a day for stuffy nose. 48 g 1  . fluticasone (FLOVENT HFA) 110 MCG/ACT inhaler 2 puffs once a day to prevent coughing or wheezing, but you may need 2 puffs twice a day if your asthma is not well controlled. 36 g 1  . levocetirizine (XYZAL) 5 MG tablet Take 1 tablet (5 mg total) by mouth every evening. 90 tablet 1  . montelukast (SINGULAIR) 10 MG tablet 1 tablet once a day for coughing or wheezing. 90 tablet 1  . Multiple Vitamin (MULTI VITAMIN MENS PO) Take 1 tablet by mouth daily.    [name] Tran 93 MCG/ACT EXHU BLOW TWO DOSES IN EACH NOSTRIL TWICE DAILY AS DIRECTED. 32 mL 11  . albuterol (VENTOLIN HFA) 108 (90 Base) MCG/ACT inhaler INHALE TWO PUFFS BY MOUTH EVERY 4 HOURS AS NEEDED FOR SHORTNESS OF BREATH OR WHEEZING 8.5 g 1   No current facility-administered medications for this visit.    Allergies  Allergen Reactions  . Peanuts [Peanut Oil] Other (See Comments)    Tree nuts    Review of systems: Review of systems negative except as noted in HPI / PMHx.  Past Medical History:  Diagnosis Date  . Asthma   . Environmental allergies   . Seasonal allergies     Family History  Problem Relation Age of Onset  . Asthma Mother   . Allergic rhinitis Mother   . Allergic rhinitis Father   . Asthma Father   . Angioedema Neg Hx   . Eczema Neg Hx   . Immunodeficiency Neg Hx   . Urticaria Neg Hx     Social History   Socioeconomic History  . Marital status: Single    Spouse name: Not on file  . Number of children: Not on file  . Years of education: Not on file  . Highest education level: Not on file  Occupational History  . Not on file  Tobacco Use  . Smoking status: Never Smoker  . Smokeless tobacco: Never Used  Vaping Use  . Vaping Use:  Never used  Substance and Sexual Activity  . Alcohol use: No  . Drug use: No  . Sexual activity: Not on file  Other Topics Concern  . Not on file  Social History Narrative  . Not on file   Social Determinants of Health   Financial Resource Strain:   . Difficulty of Paying Living Expenses: Not on file  Food Insecurity:   . Worried About Programme researcher, broadcasting/film/video in the Last Year: Not on file  . Ran Out of Food in the Last Year: Not on file  Transportation Needs:   . Lack of Transportation (Medical): Not on file  . Lack of Transportation (Non-Medical): Not on file  Physical Activity:   . Days of Exercise per Week: Not on file  . Minutes of Exercise per Session: Not on file  Stress:   . Feeling of Stress : Not on file  Social Connections:   . Frequency of Communication with Friends and Family: Not on file  . Frequency of Social Gatherings with Friends and Family: Not on file  . Attends Religious Services: Not on file  . Active Member of Clubs or Organizations: Not on file  . Attends Banker Meetings: Not on file  . Marital Status: Not on file  Intimate Partner Violence:   . Fear of Current or  Ex-Partner: Not on file  . Emotionally Abused: Not on file  . Physically Abused: Not on file  . Sexually Abused: Not on file    I appreciate the opportunity to take part in Dago's care. Please do not hesitate to contact me with questions.  Sincerely,   R. Jorene Guest, MD

## 2019-10-02 NOTE — Assessment & Plan Note (Signed)
   Continue appropriate aeroallergen avoidance measures.  Continue levocetirizine 5 mg daily as needed.  Continue Xhance nasal spray, 2 sprays per nostril 1-2 times daily as needed.  Nasal saline lavage (NeilMed) has been recommended as needed and prior to medicated nasal sprays along with instructions for proper administration.  If allergen avoidance measures and medications fail to adequately relieve symptoms, aeroallergen immunotherapy will be considered.

## 2019-10-02 NOTE — Patient Instructions (Addendum)
Moderate persistent asthma without complication Stable.  For now, continue Flovent (fluticasone) 110 g, 2 inhalations via spacer device twice a day.   During respiratory tract infections or asthma flares, increase Flovent 110g to 3 inhalations 3 times per day until symptoms have returned to baseline.  For now, continue montelukast 10 mg daily bedtime.  Continue albuterol HFA, 1-2 inhalations every 6 hours if needed.  A prescription has been provided for Ventolin HFA.  Subjective and objective measures of pulmonary function will be followed and the treatment plan will be adjusted accordingly.  Seasonal and perennial allergic rhinitis  Continue appropriate aeroallergen avoidance measures.  Continue levocetirizine 5 mg daily as needed.  Continue Xhance nasal spray, 2 sprays per nostril 1-2 times daily as needed.  Nasal saline lavage (NeilMed) has been recommended as needed and prior to medicated nasal sprays along with instructions for proper administration.  If allergen avoidance measures and medications fail to adequately relieve symptoms, aeroallergen immunotherapy will be considered.  Food allergy Continue careful avoidance of peanuts and tree nuts and have access to epinephrine autoinjector 2 pack in case of accidental ingestion. Food allergy action plan is in place.   Return in about 6 months (around 04/03/2020), or if symptoms worsen or fail to improve.

## 2019-10-02 NOTE — Assessment & Plan Note (Signed)
Stable.  For now, continue Flovent (fluticasone) 110 g, 2 inhalations via spacer device twice a day.   During respiratory tract infections or asthma flares, increase Flovent 110g to 3 inhalations 3 times per day until symptoms have returned to baseline.  For now, continue montelukast 10 mg daily bedtime.  Continue albuterol HFA, 1-2 inhalations every 6 hours if needed.  A prescription has been provided for Ventolin HFA.  Subjective and objective measures of pulmonary function will be followed and the treatment plan will be adjusted accordingly.

## 2019-10-03 ENCOUNTER — Encounter: Payer: Self-pay | Admitting: Allergy and Immunology

## 2019-11-29 ENCOUNTER — Other Ambulatory Visit: Payer: Self-pay | Admitting: Allergy and Immunology

## 2019-12-30 ENCOUNTER — Other Ambulatory Visit: Payer: Self-pay | Admitting: Allergy and Immunology

## 2020-01-27 ENCOUNTER — Other Ambulatory Visit: Payer: Self-pay | Admitting: Allergy and Immunology

## 2020-07-03 ENCOUNTER — Other Ambulatory Visit: Payer: Self-pay

## 2020-07-03 MED ORDER — ALBUTEROL SULFATE HFA 108 (90 BASE) MCG/ACT IN AERS: INHALATION_SPRAY | RESPIRATORY_TRACT | 1 refills | Status: DC

## 2020-07-03 NOTE — Telephone Encounter (Signed)
Denied flovent 110 mcg refill. Pt. Last seen 08/21. No more refills until pt. Is seen.

## 2020-07-03 NOTE — Telephone Encounter (Signed)
Denied montelukast 10 mg refill. Patient hasn't been seen since august of 2021. No refills until patient is seen. Patient needs office visit.

## 2020-07-10 ENCOUNTER — Other Ambulatory Visit: Payer: Self-pay

## 2020-08-02 ENCOUNTER — Telehealth: Payer: Managed Care, Other (non HMO)

## 2020-08-02 ENCOUNTER — Other Ambulatory Visit: Payer: Self-pay

## 2020-08-02 NOTE — Telephone Encounter (Signed)
Rx refill for Albuterol HFA denied, multiple curioty refills sent in pt needs OV, LOV 10/02/19

## 2020-08-02 NOTE — Telephone Encounter (Signed)
Rx refill for Albuterol HFA 90mcg denied, multiple curioty refills sent in pt needs OV, LOV 10/02/19 

## 2020-08-05 ENCOUNTER — Other Ambulatory Visit: Payer: Self-pay

## 2020-08-05 MED ORDER — ALBUTEROL SULFATE HFA 108 (90 BASE) MCG/ACT IN AERS: INHALATION_SPRAY | RESPIRATORY_TRACT | 0 refills | Status: DC

## 2020-08-23 ENCOUNTER — Other Ambulatory Visit: Payer: Self-pay | Admitting: Family Medicine

## 2020-09-18 IMAGING — CR DG LUMBAR SPINE 2-3V
2 series · 2 of 2 positions shown · non-contrast
Comparison: Lumbar spine films 05/27/2018

CLINICAL DATA: Decompression at L4-5

EXAM:
LUMBAR SPINE - 2-3 VIEW

[lateral (1 of 2)]
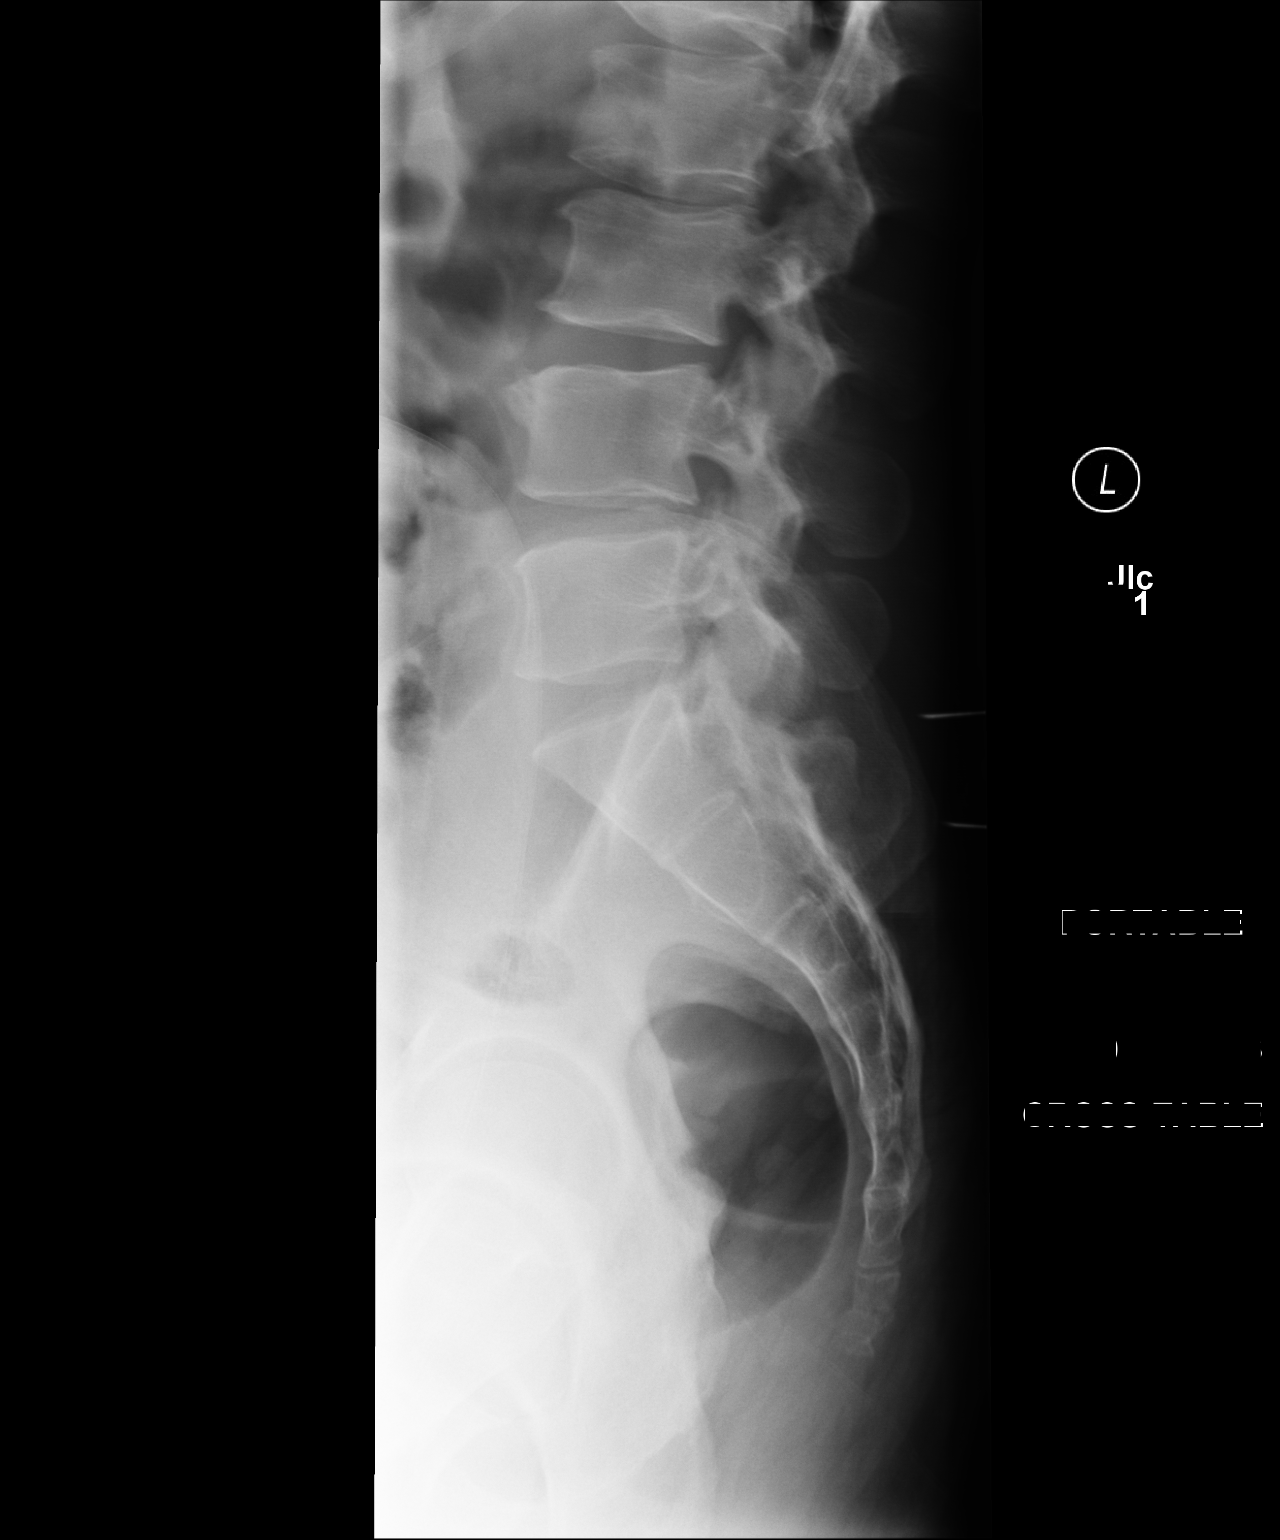

[lateral (2 of 2)]
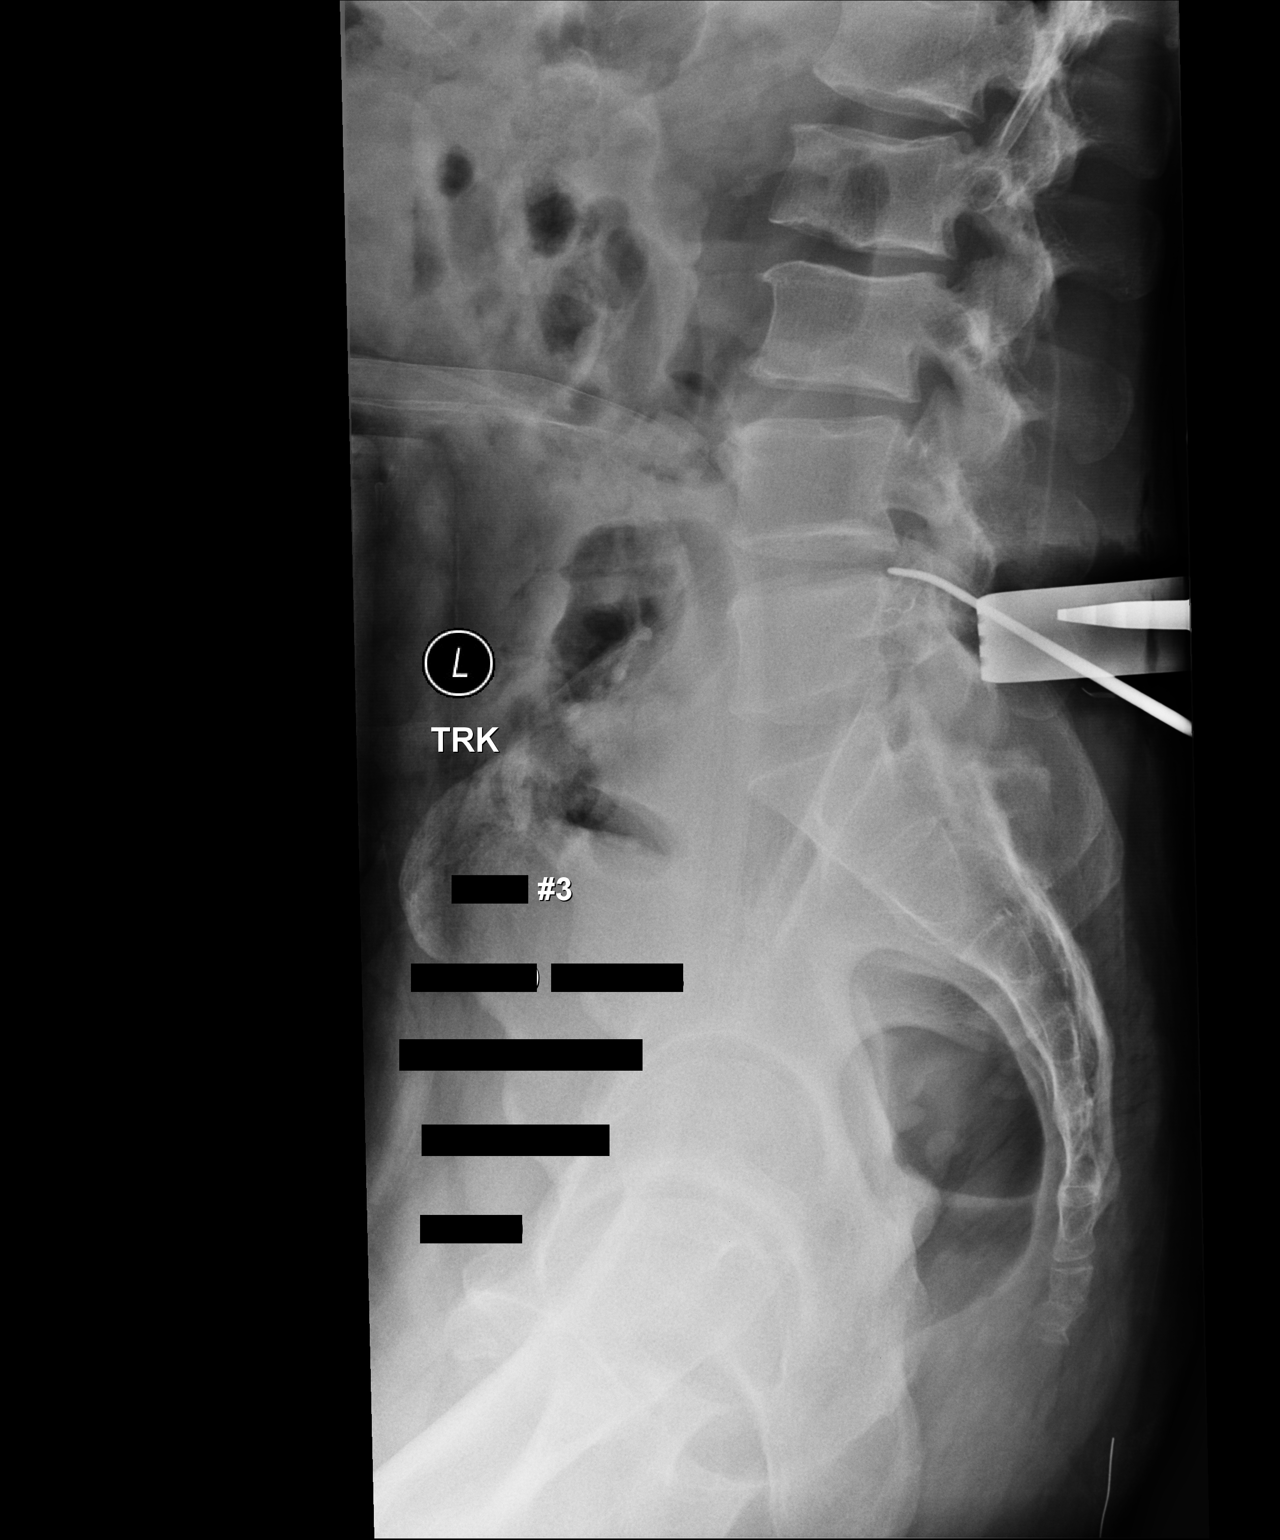

[2 of 2 positions shown; findings below may reference images not displayed]

FINDINGS: Portable cross-table lateral view of the lumbar spine shows 2
needles for localization. The more cephalad needle is just beneath
the spinous process of L5 directed toward the L5-S1 interspace. The
second needle is directed toward the posterior aspect of S1 and S2
vertebral bodies.
IMPRESSION: Needles for localization as described above.

## 2020-09-18 NOTE — Patient Instructions (Addendum)
Moderate persistent asthma Increase  Flovent 110 mcg 2 puffs twice a day with spacer to help prevent cough and wheeze During respiratory tract infections or asthma flares includes Flovent 110 mcg to 3 puffs 3 times a day until symptoms have returned to baseline. Continue montelukast 10 mg at night to help prevent cough and wheeze. May use albuterol 2 puffs every 4-6 hours as needed for cough, wheeze, tightness in chest, or shortness of breath Asthma control goals:  Full participation in all desired activities (may need albuterol before activity) Albuterol use two time or less a week on average (not counting use with activity) Cough interfering with sleep two time or less a month Oral steroids no more than once a year No hospitalizations  Seasonal and perennial allergic rhinitis Continue Xyzal 5 mg once a day as needed for runny nose Start Xhance 2 sprays each nostril 1-2 times a day as needed for nasal congestion. (Samples given)If [name] Tran is not covered continue fluticasone nasal spray 2 sprays each nostril once a day as needed for stuffy nsoe May use saline nasal spray or saline nasal rinse as needed for nasal symptoms.  Use this prior to any medicated nasal sprays. If regimen above is not helping consider allergy injections  Food allergy Avoid peanuts and tree nuts. In case of an allergic reaction, give Benadryl 4 teaspoonfuls every 4 hours, and if life-threatening symptoms occur, inject with EpiPen 0.3 mg.  Please let us know if this treatment plan is not working well for you Schedule a follow-up appointment in 2 months or sooner if needed

## 2020-09-19 ENCOUNTER — Ambulatory Visit (INDEPENDENT_AMBULATORY_CARE_PROVIDER_SITE_OTHER): Payer: Managed Care, Other (non HMO) | Admitting: Family

## 2020-09-19 ENCOUNTER — Other Ambulatory Visit: Payer: Self-pay

## 2020-09-19 ENCOUNTER — Encounter: Payer: Self-pay | Admitting: Family

## 2020-09-19 VITALS — BP 122/78 | HR 83 | Temp 98.4°F | Resp 17 | Ht 70.0 in | Wt 184.2 lb

## 2020-09-19 DIAGNOSIS — T7800XD Anaphylactic reaction due to unspecified food, subsequent encounter: Secondary | ICD-10-CM

## 2020-09-19 DIAGNOSIS — J4540 Moderate persistent asthma, uncomplicated: Secondary | ICD-10-CM | POA: Diagnosis not present

## 2020-09-19 DIAGNOSIS — J3089 Other allergic rhinitis: Secondary | ICD-10-CM | POA: Diagnosis not present

## 2020-09-19 MED ORDER — EPINEPHRINE 0.3 MG/0.3ML IJ SOAJ: 0.3000 mg | INTRAMUSCULAR | 1 refills | Status: DC | PRN

## 2020-09-19 MED ORDER — ALBUTEROL SULFATE HFA 108 (90 BASE) MCG/ACT IN AERS: INHALATION_SPRAY | RESPIRATORY_TRACT | 1 refills | Status: DC

## 2020-09-19 MED ORDER — MONTELUKAST SODIUM 10 MG PO TABS: ORAL_TABLET | ORAL | 1 refills | Status: DC

## 2020-09-19 MED ORDER — FLUTICASONE PROPIONATE HFA 110 MCG/ACT IN AERO: INHALATION_SPRAY | RESPIRATORY_TRACT | 1 refills | Status: DC

## 2020-09-19 MED ORDER — LEVOCETIRIZINE DIHYDROCHLORIDE 5 MG PO TABS: 5.0000 mg | ORAL_TABLET | Freq: Every evening | ORAL | 1 refills | Status: DC

## 2020-09-19 NOTE — Progress Notes (Addendum)
[address] [place] [address] Dept: [phone]  FOLLOW UP NOTE  Patient ID: [name] Tran, male    DOB: 06-Jan-1986  Age: 35 y.o. MRN: [id number] Date of Office Visit: 09/19/2020  Assessment  Chief Complaint: Follow-up (Pt states occasional sneezing, coughing, wheezing due to work environment. Pt report overall everything under control) and Asthma  HPI [name] Tran is a 35 year old male who presents today for follow-up of moderate persistent asthma without complication, seasonal and perennial allergic rhinitis, and food allergy.  He was last seen on October 02, 2019 by Dr. Nunzio Cobbs.  Moderate persistent asthma is reported as moderately controlled with Flovent 110 mcg 1 puff once a day with spacer, montelukast 10 mg once a day as needed, and albuterol as needed.  He reports that he has been frustrated with the practice for the past 3 months because he has not been able to get his medications that he needs.  He has called his primary care physician and they would be willing to take over his medications.  He reports that he has been using his Flovent 110 mcg 1 puff once a day because he did not want to run out.  He also has been taking montelukast 10 mg once a day as needed to make it last longer, and has been using his albuterol 1-2 times a week.  He reports that he would like to have a prescription for albuterol with 12 refills so he can have 1 albuterol inhaler in his golf bag, 1 albuterol inhaler in his [place] house, 1 albuterol in his truck, and 1 albuterol inhaler in his house.  He reports a cough due to dust and denies wheezing, tightness in his chest, shortness of breath, and nocturnal awakenings.  He has had maybe 1-2 rounds of steroids since we last saw him.  1 round being due to having COVID-19 in March.  Seasonal on perennial allergic rhinitis is reported as controlled with levocetirizine 5 mg once a day as needed, and fluticasone nasal spray 2 sprays each nostril once a day as  needed.  He is not been using any saline rinses or saline sprays.  He denies any rhinorrhea, nasal congestion, and postnasal drip.  He reports he has the symptoms only if he is sick.  He reports that he like Xhance nasal spray but reports that it was too expensive.  He has tried fluticasone nasal spray and possibly Nasacort nasal spray in the past.  He has had 1 sinus infection in the fall since we last saw him.  He continues to avoid peanuts and tree nuts without any accidental ingestion or use of his epinephrine autoinjector device.  He reports that his epinephrine autoinjector device is expired.   Drug Allergies:  Allergies  Allergen Reactions   Peanuts [Peanut Oil] Other (See Comments)    Tree nuts    Review of Systems: Review of Systems  Constitutional:  Negative for chills and fever.  HENT:         Denies rhinorrhea, nasal congestion, and postnasal drip.  Reports that he will have the symptoms only when he is sick  Eyes:        Denies itchy watery eyes  Respiratory:  Positive for cough. Negative for shortness of breath and wheezing.        Reports cough due to dust and denies wheezing, tightness in his chest, shortness of breath, and nocturnal awakenings  Cardiovascular:  Negative for chest pain and palpitations.  Gastrointestinal:  Denies heartburn and reflux symptoms  Genitourinary:  Negative for dysuria.  Skin:  Negative for itching and rash.  Neurological:  Negative for headaches.  Endo/Heme/Allergies:  Positive for environmental allergies.    Physical Exam: BP 122/78   Pulse 83   Temp 98.4 F (36.9 C) (Temporal)   Resp 17   Ht 5\' 10"  (1.778 m)   Wt 184 lb 3.2 oz (83.6 kg)   SpO2 99%   BMI 26.43 kg/m    Physical Exam Constitutional:      Appearance: Normal appearance.  HENT:     Head: Normocephalic and atraumatic.     Comments: Pharynx normal, eyes normal, ears normal, nose normal    Right Ear: Tympanic membrane, ear canal and external ear normal.      Left Ear: Tympanic membrane, ear canal and external ear normal.     Nose: Nose normal.     Mouth/Throat:     Mouth: Mucous membranes are moist.     Pharynx: Oropharynx is clear.  Eyes:     Conjunctiva/sclera: Conjunctivae normal.  Cardiovascular:     Rate and Rhythm: Regular rhythm.     Heart sounds: Normal heart sounds.  Pulmonary:     Effort: Pulmonary effort is normal.     Breath sounds: Normal breath sounds.     Comments: Lungs clear to auscultation Musculoskeletal:     Cervical back: Neck supple.  Skin:    General: Skin is warm.  Neurological:     Mental Status: He is alert and oriented to person, place, and time.  Psychiatric:        Mood and Affect: Mood normal.        Behavior: Behavior normal.        Thought Content: Thought content normal.        Judgment: Judgment normal.    Diagnostics: FVC 4.96 L, FEV1 2.83 L.  Predicted FVC 5.34 L, predicted FEV1 4.34 L.  Spirometry indicates possible moderate obstruction.  Post bronchodilator response shows FVC 5.08 L, FEV1 3.13 L.  Spirometry indicates possible mild obstruction with a 11% change in FEV1  Assessment and Plan: 1. Not well controlled moderate persistent asthma   2. Seasonal and perennial allergic rhinitis   3. Anaphylactic shock due to food, subsequent encounter     Meds ordered this encounter  Medications   montelukast (SINGULAIR) 10 MG tablet    Sig: 1 tablet once a day for coughing or wheezing.    Dispense:  90 tablet    Refill:  1   levocetirizine (XYZAL) 5 MG tablet    Sig: Take 1 tablet (5 mg total) by mouth every evening.    Dispense:  90 tablet    Refill:  1   albuterol (VENTOLIN HFA) 108 (90 Base) MCG/ACT inhaler    Sig: INHALE TWO PUFFS BY MOUTH EVERY 4 HOURS AS NEEDED FOR FOR SHORTNESS OF BREATH OR WHEEZING    Dispense:  18 g    Refill:  1   EPINEPHrine 0.3 mg/0.3 mL IJ SOAJ injection    Sig: Inject 0.3 mg into the muscle as needed. AUVIQ    Dispense:  1 each    Refill:  1   fluticasone  (FLOVENT HFA) 110 MCG/ACT inhaler    Sig: Use 2 puffs twice a day with spacer to help prevent cough and wheeze    Dispense:  36 g    Refill:  1    Dispense 90 day supply.     Patient Instructions  Moderate persistent asthma Increase  Flovent 110 mcg 2 puffs twice a day with spacer to help prevent cough and wheeze During respiratory tract infections or asthma flares includes Flovent 110 mcg to 3 puffs 3 times a day until symptoms have returned to baseline. Continue montelukast 10 mg at night to help prevent cough and wheeze. May use albuterol 2 puffs every 4-6 hours as needed for cough, wheeze, tightness in chest, or shortness of breath Asthma control goals:  Full participation in all desired activities (may need albuterol before activity) Albuterol use two time or less a week on average (not counting use with activity) Cough interfering with sleep two time or less a month Oral steroids no more than once a year No hospitalizations  Seasonal and perennial allergic rhinitis Continue Xyzal 5 mg once a day as needed for runny nose Start Xhance 2 sprays each nostril 1-2 times a day as needed for nasal congestion. (Samples given)If [name] Tran is not covered continue fluticasone nasal spray 2 sprays each nostril once a day as needed for stuffy nsoe May use saline nasal spray or saline nasal rinse as needed for nasal symptoms.  Use this prior to any medicated nasal sprays. If regimen above is not helping consider allergy injections  Food allergy Avoid peanuts and tree nuts. In case of an allergic reaction, give Benadryl 4 teaspoonfuls every 4 hours, and if life-threatening symptoms occur, inject with EpiPen 0.3 mg.  Please let us know if this treatment plan is not working well for you Schedule a follow-up appointment in 2 months or sooner if needed  Return in about 2 months (around 11/19/2020), or if symptoms worsen or fail to improve.    Thank you for the opportunity to care for this patient.   Please do not hesitate to contact me with questions.  Nehemiah Settle, FNP [facility]

## 2020-09-23 ENCOUNTER — Other Ambulatory Visit: Payer: Self-pay

## 2020-09-23 MED ORDER — EPINEPHRINE 0.3 MG/0.3ML IJ SOAJ: 0.3000 mg | INTRAMUSCULAR | 1 refills | Status: DC | PRN

## 2020-09-24 ENCOUNTER — Telehealth: Payer: Self-pay | Admitting: Family

## 2020-09-24 DIAGNOSIS — J3089 Other allergic rhinitis: Secondary | ICD-10-CM

## 2020-09-24 NOTE — Telephone Encounter (Signed)
Pt states [facility] request a new prescription for xhance.  Pt states he was prescribed Auvi-q but wife pick up an epi pen from a pharmacy. He states he can't use the regular epi pen, he needs the Auvi q but because he picked up the epi pen  ins will not allow him to get auvi q.

## 2020-09-25 NOTE — Telephone Encounter (Signed)
Called patient back to see what was going on with the Auvi-Q and Epi-Pen. Patient stated, we sent in the Epi-Pen to a local pharmacy that his wife picked up along with other medications. He stated we were suppose to send in Auvi-Q. Because we sent in the wrong prescription the insurance will not pay for the Auvi-Q. Patient stated "we should pay for the Auvi-Q or there is going to be problems". Advised patient that even though we sent in the Epi-Pen, if he didn't want it that it should not have been picked up by his wife. He would like for Nehemiah Settle, FNP to give him a call back. Xhance sent to [facility]. Please advise. Thanks.

## 2020-09-25 NOTE — Telephone Encounter (Signed)
[name] Tran, Please see above.

## 2020-09-27 ENCOUNTER — Telehealth: Payer: Self-pay | Admitting: Family

## 2020-09-27 MED ORDER — EPINEPHRINE 0.3 MG/0.3ML IJ SOAJ
0.3000 mg | INTRAMUSCULAR | 2 refills | Status: DC | PRN
Start: 2020-09-27 — End: 2021-06-30

## 2020-09-27 NOTE — Telephone Encounter (Signed)
Kim from [facility] pharmacy is stating an RX was written for a epi-pen they only have auviq please advise

## 2020-09-27 NOTE — Telephone Encounter (Signed)
Called and spoke with Roe Coombs (pharmacist) at [facility].   Called patient and left detailed voicemail. Nehemiah Settle, FNP notified.

## 2020-09-27 NOTE — Telephone Encounter (Signed)
[name] Tran has been sent to [facility].

## 2020-09-30 ENCOUNTER — Encounter: Payer: Self-pay | Admitting: Emergency Medicine

## 2020-09-30 ENCOUNTER — Emergency Department
Admission: EM | Admit: 2020-09-30 | Discharge: 2020-09-30 | Disposition: A | Discharge status: Home / Self Care | Payer: Managed Care, Other (non HMO) | Source: Home / Self Care

## 2020-09-30 ENCOUNTER — Other Ambulatory Visit: Payer: Self-pay

## 2020-09-30 DIAGNOSIS — R059 Cough, unspecified: Secondary | ICD-10-CM | POA: Diagnosis not present

## 2020-09-30 DIAGNOSIS — J4541 Moderate persistent asthma with (acute) exacerbation: Secondary | ICD-10-CM

## 2020-09-30 MED ORDER — METHYLPREDNISOLONE 4 MG PO TBPK: ORAL_TABLET | ORAL | 0 refills | Status: DC

## 2020-09-30 MED ORDER — METHYLPREDNISOLONE ACETATE 80 MG/ML IJ SUSP
80.0000 mg | Freq: Once | INTRAMUSCULAR | Status: AC
  Administered 2020-09-30: 80 mg via INTRAMUSCULAR

## 2020-09-30 MED ORDER — BUDESONIDE-FORMOTEROL FUMARATE 80-4.5 MCG/ACT IN AERO: 2.0000 | INHALATION_SPRAY | Freq: Two times a day (BID) | RESPIRATORY_TRACT | 0 refills | Status: DC

## 2020-09-30 NOTE — ED Triage Notes (Signed)
Cough started Saturday, patient has asthma  Vaccinated

## 2020-09-30 NOTE — Discharge Instructions (Addendum)
Advised patient to take medication as directed with food to completion.  Encouraged patient increase daily water intake while taking these medications.  Advised/encouraged patient may start Symbicort 2 puffs twice daily once Medrol Dosepak has been completed.  Advised/encouraged patient to follow-up with his PCP, local ED, or here if asthma exacerbation worsen and/or unresolved.

## 2020-09-30 NOTE — ED Provider Notes (Signed)
[name]    CSN: [id number] Arrival date & time: 09/30/20  0818      History   Chief Complaint Chief Complaint  Patient presents with   Cough    HPI [name] Tran is a 35 y.o. male.   HPI 35 year old male presents with cough for 2 days, PMH significant for mild/moderate intermittent/persistent asthma without complication.  Past Medical History:  Diagnosis Date   Asthma    Environmental allergies    Seasonal allergies     Patient Active Problem List   Diagnosis Date Noted   Allergic conjunctivitis of both eyes 06/30/2018   HNP (herniated nucleus pulposus), lumbar 02/17/2018   Spinal stenosis, lumbar 02/17/2018   Viral upper respiratory tract infection with cough 11/09/2017   Seasonal allergic rhinitis due to pollen 11/09/2017   Moderate persistent asthma without complication 05/12/2017   Seasonal and perennial allergic rhinitis 05/12/2017   Allergic conjunctivitis 05/12/2017   Food allergy 05/12/2017   Mild intermittent asthma with acute exacerbation 09/02/2015    Past Surgical History:  Procedure Laterality Date   HAND SURGERY     KNEE SURGERY     LUMBAR LAMINECTOMY/DECOMPRESSION MICRODISCECTOMY N/A 02/17/2018   Procedure: Microlumbar decompression Lumbar Four-Five;  Surgeon: Jene Every, MD;  Location: [facility] OR;  Service: Orthopedics;  Laterality: N/A;  Microlumbar decompression Lumbar Four-Five       Home Medications    Prior to Admission medications   Medication Sig Start Date End Date Taking? Authorizing Provider  budesonide-formoterol (SYMBICORT) 80-4.5 MCG/ACT inhaler Inhale 2 puffs into the lungs 2 (two) times daily. 09/30/20 10/30/20 Yes Trevor Iha, FNP  methylPREDNISolone (MEDROL DOSEPAK) 4 MG TBPK tablet Take as directed 09/30/20  Yes Trevor Iha, FNP  albuterol (VENTOLIN HFA) 108 (90 Base) MCG/ACT inhaler INHALE TWO PUFFS BY MOUTH EVERY 4 HOURS AS NEEDED FOR FOR SHORTNESS OF BREATH OR WHEEZING 09/19/20   Nehemiah Settle, FNP   EPINEPHrine (AUVI-Q) 0.3 mg/0.3 mL IJ SOAJ injection Inject 0.3 mg into the muscle as needed for anaphylaxis. 09/27/20   Nehemiah Settle, FNP  fexofenadine (ALLEGRA) 180 MG tablet Take 180 mg by mouth daily as needed for allergies.     [provider]  fluticasone (FLONASE) 50 MCG/ACT nasal spray 1 spray per nostril twice a day for stuffy nose. 10/02/19   Bobbitt, Heywood Iles, MD  fluticasone (FLOVENT HFA) 110 MCG/ACT inhaler Use 2 puffs twice a day with spacer to help prevent cough and wheeze 09/19/20   Nehemiah Settle, FNP  levocetirizine (XYZAL) 5 MG tablet Take 1 tablet (5 mg total) by mouth every evening. 09/19/20   Nehemiah Settle, FNP  montelukast (SINGULAIR) 10 MG tablet 1 tablet once a day for coughing or wheezing. 09/19/20   Nehemiah Settle, FNP  Multiple Vitamin (MULTI VITAMIN MENS PO) Take 1 tablet by mouth daily.    [provider]  Timmothy Sours 93 MCG/ACT EXHU BLOW TWO DOSES IN EACH NOSTRIL TWICE DAILY AS DIRECTED. Patient not taking: Reported on 09/19/2020 08/02/18   Hetty Blend, FNP    Family History Family History  Problem Relation Age of Onset   Asthma Mother    Allergic rhinitis Mother    Allergic rhinitis Father    Asthma Father    Angioedema Neg Hx    Eczema Neg Hx    Immunodeficiency Neg Hx    Urticaria Neg Hx     Social History Social History   Tobacco Use   Smoking status: Never   Smokeless tobacco: Never  Vaping Use  Vaping Use: Never used  Substance Use Topics   Alcohol use: Yes   Drug use: No     Allergies   Peanuts [peanut oil]   Review of Systems Review of Systems  Respiratory:  Positive for cough.        Asthma exacerbation x 2 days    Physical Exam Triage Vital Signs ED Triage Vitals  Enc Vitals Group     BP 09/30/20 0846 134/82     Pulse Rate 09/30/20 0846 77     Resp 09/30/20 0846 18     Temp 09/30/20 0846 98.9 F (37.2 C)     Temp Source 09/30/20 0846 Oral     SpO2 09/30/20 0846 99 %     Weight 09/30/20 0847 182 lb  (82.6 kg)     Height 09/30/20 0847 5\' 10"  (1.778 m)     Head Circumference --      Peak Flow --      Pain Score 09/30/20 0847 0     Pain Loc --      Pain Edu? --      Excl. in GC? --    No data found.  Updated Vital Signs BP 134/82 (BP Location: Right Arm)   Pulse 77   Temp 98.9 F (37.2 C) (Oral)   Resp 18   Ht 5\' 10"  (1.778 m)   Wt 182 lb (82.6 kg)   SpO2 99%   BMI 26.11 kg/m     Physical Exam Vitals and nursing note reviewed.  Constitutional:      General: He is not in acute distress.    Appearance: Normal appearance. He is normal weight.  HENT:     Head: Normocephalic and atraumatic.     Right Ear: Tympanic membrane, ear canal and external ear normal.     Left Ear: Tympanic membrane, ear canal and external ear normal.     Mouth/Throat:     Mouth: Mucous membranes are moist.     Pharynx: Oropharynx is clear.  Eyes:     Extraocular Movements: Extraocular movements intact.     Conjunctiva/sclera: Conjunctivae normal.     Pupils: Pupils are equal, round, and reactive to light.  Cardiovascular:     Rate and Rhythm: Normal rate and regular rhythm.     Pulses: Normal pulses.     Heart sounds: Normal heart sounds.  Pulmonary:     Effort: Pulmonary effort is normal. No respiratory distress.     Breath sounds: Normal breath sounds. No stridor. No wheezing, rhonchi or rales.     Comments: Mild tightness of upper airways, frequent nonproductive cough noted on exam Chest:     Chest wall: No tenderness.  Musculoskeletal:        General: Normal range of motion.     Cervical back: Normal range of motion and neck supple. No tenderness.  Lymphadenopathy:     Cervical: No cervical adenopathy.  Skin:    General: Skin is warm and dry.  Neurological:     General: No focal deficit present.     Mental Status: He is alert and oriented to person, place, and time. Mental status is at baseline.  Psychiatric:        Mood and Affect: Mood normal.        Behavior: Behavior normal.         Thought Content: Thought content normal.     UC Treatments / Results  Labs (all labs ordered are listed, but only abnormal results are  displayed) Labs Reviewed - No data to display  EKG   Radiology No results found.  Procedures Procedures (including critical care time)  Medications Ordered in UC Medications  methylPREDNISolone acetate (DEPO-MEDROL) injection 80 mg (80 mg Intramuscular Given 09/30/20 0925)    Initial Impression / Assessment and Plan / UC Course  I have reviewed the triage vital signs and the nursing notes.  Pertinent labs & imaging results that were available during my care of the patient were reviewed by me and considered in my medical decision making (see chart for details).     MDM: 1. Cough-IM Depo-Medrol 80 given once in clinic prior to discharge today, 2.  Moderate persistent asthma with exacerbation-Rx'd Medrol Dosepak and Symbicort. Advised patient to take medication as directed with food to completion.  Encouraged patient increase daily water intake while taking these medications.  Advised/encouraged patient may start Symbicort 2 puffs twice daily once Medrol Dosepak has been completed.  Advised/encouraged patient to follow-up with his PCP, local ED, or here if asthma exacerbation worsen and/or unresolved.  Patient discharged home, hemodynamically stable. Final Clinical Impressions(s) / UC Diagnoses   Final diagnoses:  Cough  Moderate persistent asthma with exacerbation     Discharge Instructions      Advised patient to take medication as directed with food to completion.  Encouraged patient increase daily water intake while taking these medications.  Advised/encouraged patient may start Symbicort 2 puffs twice daily once Medrol Dosepak has been completed.  Advised/encouraged patient to follow-up with his PCP, local ED, or here if asthma exacerbation worsen and/or unresolved.       ED Prescriptions     Medication Sig Dispense Auth.  Provider   methylPREDNISolone (MEDROL DOSEPAK) 4 MG TBPK tablet Take as directed 1 each Trevor Iha, FNP   budesonide-formoterol (SYMBICORT) 80-4.5 MCG/ACT inhaler Inhale 2 puffs into the lungs 2 (two) times daily. 1 each Trevor Iha, FNP      PDMP not reviewed this encounter.   Trevor Iha, FNP 09/30/20 [phone]

## 2020-10-01 MED ORDER — XHANCE 93 MCG/ACT NA EXHU: INHALANT_SUSPENSION | NASAL | 5 refills | Status: DC

## 2020-10-28 ENCOUNTER — Other Ambulatory Visit: Payer: Self-pay

## 2020-10-28 MED ORDER — FLUTICASONE PROPIONATE HFA 110 MCG/ACT IN AERO: INHALATION_SPRAY | RESPIRATORY_TRACT | 1 refills | Status: DC

## 2020-10-28 MED ORDER — ALBUTEROL SULFATE HFA 108 (90 BASE) MCG/ACT IN AERS: INHALATION_SPRAY | RESPIRATORY_TRACT | 1 refills | Status: DC

## 2020-12-27 ENCOUNTER — Other Ambulatory Visit: Payer: Self-pay | Admitting: Family

## 2020-12-28 NOTE — Telephone Encounter (Signed)
It looks like a prescription was just sent in sent in September with 1 refill. Please see how often he is needing to use his albuterol inhaler.

## 2021-02-25 ENCOUNTER — Other Ambulatory Visit: Payer: Self-pay | Admitting: Family

## 2021-03-21 ENCOUNTER — Other Ambulatory Visit: Payer: Self-pay | Admitting: Family

## 2021-03-22 NOTE — Telephone Encounter (Signed)
It looks like this medication was already sent on 02/1721. Does he need another refill already? If so he needs to schedule a follow up appointment.

## 2021-05-16 ENCOUNTER — Other Ambulatory Visit: Payer: Self-pay | Admitting: Family

## 2021-05-17 NOTE — Telephone Encounter (Signed)
He needs to schedule a follow up appointment for further refills. It sounds like his asthma is not under good control. A prescription was sent 03/24/21 with 1 refill

## 2021-05-19 NOTE — Telephone Encounter (Signed)
Appt scheduled and refill sent ?

## 2021-05-30 ENCOUNTER — Other Ambulatory Visit: Payer: Self-pay

## 2021-05-30 ENCOUNTER — Telehealth: Payer: Self-pay

## 2021-05-30 DIAGNOSIS — J3089 Other allergic rhinitis: Secondary | ICD-10-CM

## 2021-05-30 MED ORDER — XHANCE 93 MCG/ACT NA EXHU: 2.0000 | INHALANT_SUSPENSION | Freq: Two times a day (BID) | NASAL | 0 refills | Status: DC

## 2021-05-30 MED ORDER — XHANCE 93 MCG/ACT NA EXHU
INHALANT_SUSPENSION | NASAL | 0 refills | Status: DC
Start: 2021-05-30 — End: 2021-06-30

## 2021-05-30 NOTE — Telephone Encounter (Signed)
Ok to refill [facility]. Make sure he keeps his follow up appointment in May.

## 2021-05-30 NOTE — Telephone Encounter (Signed)
Pt pharmacy requesting refill for Fluticasone Propionate (XHANCE) 93 MCG/ACT EXHU [[id number]] ?

## 2021-05-30 NOTE — Addendum Note (Signed)
Addended by: Maryjean Morn D on: 05/30/2021 02:59 PM ? ? Modules accepted: Orders ? ?

## 2021-05-30 NOTE — Telephone Encounter (Signed)
1 refill of xhance sent to [facility]- no refills.  ?

## 2021-05-30 NOTE — Telephone Encounter (Signed)
Court[facility] refill sent in with 0 refill. Pt have appt. 06/16/21 ?

## 2021-06-04 ENCOUNTER — Other Ambulatory Visit: Payer: Self-pay

## 2021-06-04 DIAGNOSIS — J3089 Other allergic rhinitis: Secondary | ICD-10-CM

## 2021-06-16 ENCOUNTER — Ambulatory Visit: Payer: Managed Care, Other (non HMO) | Admitting: Internal Medicine

## 2021-06-24 ENCOUNTER — Other Ambulatory Visit: Payer: Self-pay | Admitting: Family

## 2021-06-27 NOTE — Telephone Encounter (Signed)
Do not refill. Rx already sent a month ago. He needs to keep his follow up appointment on 06/30/21

## 2021-06-30 ENCOUNTER — Ambulatory Visit (INDEPENDENT_AMBULATORY_CARE_PROVIDER_SITE_OTHER): Payer: Managed Care, Other (non HMO) | Admitting: Internal Medicine

## 2021-06-30 ENCOUNTER — Encounter: Payer: Self-pay | Admitting: Internal Medicine

## 2021-06-30 VITALS — BP 130/76 | HR 79 | Temp 98.0°F | Resp 16

## 2021-06-30 DIAGNOSIS — H1013 Acute atopic conjunctivitis, bilateral: Secondary | ICD-10-CM | POA: Diagnosis not present

## 2021-06-30 DIAGNOSIS — J3089 Other allergic rhinitis: Secondary | ICD-10-CM | POA: Diagnosis not present

## 2021-06-30 DIAGNOSIS — J4540 Moderate persistent asthma, uncomplicated: Secondary | ICD-10-CM | POA: Diagnosis not present

## 2021-06-30 MED ORDER — LEVOCETIRIZINE DIHYDROCHLORIDE 5 MG PO TABS
5.0000 mg | ORAL_TABLET | Freq: Every evening | ORAL | 1 refills | Status: DC
Start: 2021-06-30 — End: 2022-03-27

## 2021-06-30 MED ORDER — MONTELUKAST SODIUM 10 MG PO TABS: ORAL_TABLET | ORAL | 1 refills | Status: DC

## 2021-06-30 MED ORDER — FLOVENT HFA 110 MCG/ACT IN AERO: INHALATION_SPRAY | RESPIRATORY_TRACT | 1 refills | Status: DC

## 2021-06-30 MED ORDER — EPINEPHRINE 0.3 MG/0.3ML IJ SOAJ: 0.3000 mg | INTRAMUSCULAR | 2 refills | Status: AC | PRN

## 2021-06-30 MED ORDER — ALBUTEROL SULFATE HFA 108 (90 BASE) MCG/ACT IN AERS: INHALATION_SPRAY | RESPIRATORY_TRACT | 1 refills | Status: DC

## 2021-06-30 NOTE — Patient Instructions (Addendum)
Moderate persistent asthma - well controlled  -Continue Flovent 110 mcg 2 puffs twice a day -Can step up to 3 puffs twice a day as needed in the setting of a URI or worsening symptoms  for 7 days  - Continue montelukast 10 mg at night to help prevent cough and wheeze. May use albuterol 2 puffs every 4-6 hours as needed for cough, wheeze, tightness in chest, or shortness of breath Asthma control goals:  Full participation in all desired activities (may need albuterol before activity) Albuterol use two time or less a week on average (not counting use with activity) Cough interfering with sleep two time or less a month Oral steroids no more than once a year No hospitalizations   Seasonal and perennial allergic rhinitis -Continue Xyzal 5 mg once a day as needed for runny nose -Continue Xhance 2 sprays each nostril 1-2 times a day as needed for nasal congestion.  -May use saline nasal spray or saline nasal rinse as needed for nasal symptoms.  Use this prior to any medicated nasal sprays. -If regimen above is not helping consider allergy injections   Food allergy -Avoid peanuts and tree nuts. In case of an allergic reaction, give Benadryl 4 teaspoonfuls every 4 hours, and if life-threatening symptoms occur, inject with Auvi-Q 0.3 mg IM -Okay to reintroduce pecans and walnuts would hold off on any other tree nuts and peanuts at this time  Follow up: 6 months   Thank you so much for letting me partake in your care today.  Don't hesitate to reach out if you have any additional concerns!  Ferol Luz, MD  [facility] [place] [place]

## 2021-06-30 NOTE — Progress Notes (Signed)
Follow Up Note  RE: [name] MRN: [id number] DOB: 05-19-1985 Date of Office Visit: 06/30/2021  Referring provider: Forrestine Him, PA-C Primary care provider: Forrestine Him, PA-C  Chief Complaint: Asthma  History of Present Illness: I had the pleasure of seeing [name] Tran for a follow up visit at the [facility] [place] on 06/30/2021. He is a 36 y.o. male, who is being followed for persistent asthma, allergic rhinitis, peanut and tree nut allergy. His previous allergy office visit was on 09/19/2020 with Althea Charon FNP. Today is a regular follow up visit.  ASTHMA - Medical therapy: Flovent 110 mcg 2 puffs twice a day - Rescue inhaler use:  once or twice as needed due to outdoor exposure  - Symptoms: cough, wheeze,  - Exacerbation history: 0 ABX for respiratory illness since last visit, 0 OCS, 0ED, 0 UC visits in the past year ( will usually need ABX/OCS in the fall)  - ACT: 21 /25 - Adverse effects of medication: denies  - Previous FEV1: 2.83 L , at last visit he had possible mild obstruction with 11% change in FEV1 postbronchodilator  Allergic rhinitis: current therapy: Levocetirizine 5 mg daily, montelukast 10 mg daily he was started on Xhance 2 sprays each nostril 1-2 times daily via samples at last visit,  symptoms improved symptoms include:  feels like it is well controlled  Previous allergy testing: yes History of reflux/heartburn: no Interested in Allergy Immunotherapy: no  Food Allergy: continues to avoid peanut and tree nut -1 accidental exposures with pecan pie without issues.  Is not interested in reintroducing any other tree nuts at this time.  - No  use of epinephrine - Previous testing: needs updating    Assessment and Plan: [name] Tran is a 36 y.o. male with: Moderate persistent asthma without complication  Seasonal and perennial allergic rhinitis  Allergic conjunctivitis of both eyes Plan: Patient Instructions   Moderate persistent asthma - well  controlled  -Continue Flovent 110 mcg 2 puffs twice a day -Can step up to 3 puffs twice a day as needed in the setting of a URI or worsening symptoms  for 7 days  - Continue montelukast 10 mg at night to help prevent cough and wheeze. May use albuterol 2 puffs every 4-6 hours as needed for cough, wheeze, tightness in chest, or shortness of breath Asthma control goals:  Full participation in all desired activities (may need albuterol before activity) Albuterol use two time or less a week on average (not counting use with activity) Cough interfering with sleep two time or less a month Oral steroids no more than once a year No hospitalizations   Seasonal and perennial allergic rhinitis -Continue Xyzal 5 mg once a day as needed for runny nose -Continue Xhance 2 sprays each nostril 1-2 times a day as needed for nasal congestion.  -May use saline nasal spray or saline nasal rinse as needed for nasal symptoms.  Use this prior to any medicated nasal sprays. -If regimen above is not helping consider allergy injections   Food allergy -Avoid peanuts and tree nuts. In case of an allergic reaction, give Benadryl 4 teaspoonfuls every 4 hours, and if life-threatening symptoms occur, inject with Auvi-Q 0.3 mg IM -Okay to reintroduce pecans and walnuts would hold off on any other tree nuts and peanuts at this time  Follow up: 6 months   Thank you so much for letting me partake in your care today.  Don't hesitate to reach out if you have  any additional concerns!  Roney Marion, MD  [facility] [place] [place]  No follow-ups on file.  Meds ordered this encounter  Medications   albuterol (VENTOLIN HFA) 108 (90 Base) MCG/ACT inhaler    Sig: INHALE TWO PUFFS BY MOUTH EVERY 4 HOURS AS NEEDED FOR WHEEZING OR FOR SHORTNESS OF BREATH    Dispense:  3 each    Refill:  1   EPINEPHrine (AUVI-Q) 0.3 mg/0.3 mL IJ SOAJ injection    Sig: Inject 0.3 mg into the muscle as needed for anaphylaxis.     Dispense:  2 each    Refill:  2    [phone]   FLOVENT HFA 110 MCG/ACT inhaler    Sig: Use 2 puffs twice a day with spacer to help prevent cough and wheeze    Dispense:  36 g    Refill:  1   levocetirizine (XYZAL) 5 MG tablet    Sig: Take 1 tablet (5 mg total) by mouth every evening.    Dispense:  90 tablet    Refill:  1   montelukast (SINGULAIR) 10 MG tablet    Sig: 1 tablet once a day for coughing or wheezing.    Dispense:  90 tablet    Refill:  1    Lab Orders  No laboratory test(s) ordered today   Diagnostics: None performed    Medication List:  Current Outpatient Medications  Medication Sig Dispense Refill   fluticasone (FLONASE) 50 MCG/ACT nasal spray 1 spray per nostril twice a day for stuffy nose. 48 g 1   Fluticasone Propionate (XHANCE) 93 MCG/ACT EXHU Place 2 sprays into both nostrils in the morning and at bedtime. 16 mL 0   Multiple Vitamin (MULTI VITAMIN MENS PO) Take 1 tablet by mouth daily.     albuterol (VENTOLIN HFA) 108 (90 Base) MCG/ACT inhaler INHALE TWO PUFFS BY MOUTH EVERY 4 HOURS AS NEEDED FOR WHEEZING OR FOR SHORTNESS OF BREATH 3 each 1   budesonide-formoterol (SYMBICORT) 80-4.5 MCG/ACT inhaler Inhale 2 puffs into the lungs 2 (two) times daily. 1 each 0   EPINEPHrine (AUVI-Q) 0.3 mg/0.3 mL IJ SOAJ injection Inject 0.3 mg into the muscle as needed for anaphylaxis. 2 each 2   FLOVENT HFA 110 MCG/ACT inhaler Use 2 puffs twice a day with spacer to help prevent cough and wheeze 36 g 1   levocetirizine (XYZAL) 5 MG tablet Take 1 tablet (5 mg total) by mouth every evening. 90 tablet 1   montelukast (SINGULAIR) 10 MG tablet 1 tablet once a day for coughing or wheezing. 90 tablet 1   No current facility-administered medications for this visit.   Allergies: Allergies  Allergen Reactions   Peanuts [Peanut Oil] Other (See Comments)    Tree nuts   I reviewed his past medical history, social history, family history, and environmental history and no  significant changes have been reported from his previous visit.  ROS: All others negative except as noted per HPI.   Objective: BP 130/76   Pulse 79   Temp 98 F (36.7 C) (Temporal)   Resp 16   SpO2 99%  There is no height or weight on file to calculate BMI. General Appearance:  Alert, cooperative, no distress, appears stated age  Head:  Normocephalic, without obvious abnormality, atraumatic  Eyes:  Conjunctiva clear, EOM's intact  Nose: Nares normal,  erythematous nasal mucosa , hypertrophic turbinates, no visible anterior polyps, and septum midline  Throat: Lips, tongue normal; teeth and gums normal, normal  posterior oropharynx, tonsils 2+, and no tonsillar exudate  Neck: Supple, symmetrical  Lungs:   clear to auscultation bilaterally, Respirations unlabored, no coughing  Heart:  regular rate and rhythm and no murmur, Appears well perfused  Extremities: No edema  Skin: Skin color, texture, turgor normal, no rashes or lesions on visualized portions of skin  Neurologic: No gross deficits   Previous notes and tests were reviewed. The plan was reviewed with the patient/family, and all questions/concerned were addressed.  It was my pleasure to see Zaylen today and participate in his care. Please feel free to contact me with any questions or concerns.  Sincerely,  Roney Marion, MD  Allergy & Immunology  [facility] [place] [facility] Office: [phone]

## 2021-07-10 ENCOUNTER — Other Ambulatory Visit: Payer: Self-pay

## 2021-07-10 MED ORDER — XHANCE 93 MCG/ACT NA EXHU
2.0000 | INHALANT_SUSPENSION | Freq: Two times a day (BID) | NASAL | 5 refills | Status: AC
Start: 2021-07-10 — End: ?

## 2022-01-06 ENCOUNTER — Other Ambulatory Visit: Payer: Self-pay | Admitting: Internal Medicine

## 2022-03-03 ENCOUNTER — Other Ambulatory Visit: Payer: Self-pay | Admitting: Internal Medicine

## 2022-03-03 ENCOUNTER — Telehealth: Payer: Self-pay | Admitting: Internal Medicine

## 2022-03-03 MED ORDER — FLUTICASONE PROPIONATE HFA 110 MCG/ACT IN AERO: 2.0000 | INHALATION_SPRAY | Freq: Two times a day (BID) | RESPIRATORY_TRACT | 5 refills | Status: AC

## 2022-03-03 NOTE — Telephone Encounter (Signed)
Sent in generic flovent

## 2022-03-03 NOTE — Telephone Encounter (Signed)
[facility] called stating patients insurance will not pay for brand only generic asking if this is ok for RX FLOVENT HFA 110 MCG/ACT inhaler [[id number]] please advise

## 2022-03-12 ENCOUNTER — Other Ambulatory Visit (HOSPITAL_COMMUNITY): Payer: Self-pay

## 2022-03-27 ENCOUNTER — Other Ambulatory Visit: Payer: Self-pay | Admitting: Internal Medicine

## 2022-04-07 ENCOUNTER — Other Ambulatory Visit: Payer: Self-pay | Admitting: Internal Medicine

## 2022-05-08 ENCOUNTER — Other Ambulatory Visit: Payer: Self-pay | Admitting: Internal Medicine

## 2022-06-02 ENCOUNTER — Other Ambulatory Visit: Payer: Self-pay | Admitting: Internal Medicine

## 2022-06-18 ENCOUNTER — Other Ambulatory Visit: Payer: Self-pay | Admitting: Internal Medicine

## 2023-06-23 ENCOUNTER — Other Ambulatory Visit: Payer: Self-pay | Admitting: Internal Medicine

## 2023-06-23 NOTE — Telephone Encounter (Signed)
 Received request for patient flovent  hf inhaler. sent in denial. patient has not been seen since 06/30/2021

## 2023-06-24 ENCOUNTER — Other Ambulatory Visit: Payer: Self-pay | Admitting: Internal Medicine
